# Patient Record
Sex: Male | Born: 1975 | Race: Black or African American | Hispanic: No | Marital: Married | Smoking: Current every day smoker
Health system: Southern US, Community
[De-identification: ages and names within clinical notes are randomized; demographics above are authoritative.]

## PROBLEM LIST (undated history)

## (undated) DIAGNOSIS — I10 Essential (primary) hypertension: Secondary | ICD-10-CM

---

## 2013-07-09 NOTE — ED Provider Notes (Signed)
 History   Chief Complaint  Patient presents with  . Hematuria  . Back Pain   Patient is a 38 y.o. male presenting with abdominal pain. The history is provided by the patient.  Abdominal Pain Pain location:  R flank Pain quality: aching   Pain radiates to:  Does not radiate Pain severity:  Moderate Onset quality:  Gradual Duration:  1 day Timing:  Constant Progression:  Worsening Chronicity:  New Relieved by:  Nothing Worsened by:  Nothing tried Ineffective treatments:  None tried Associated symptoms: chest pain, dysuria and hematuria   Associated symptoms: no anorexia, no belching, no chills, no constipation, no cough, no diarrhea, no fatigue, no fever, no flatus, no hematemesis, no hematochezia, no melena, no nausea, no shortness of breath and no vomiting     Past Medical History  Diagnosis Date  . Tobacco abuse   . Seasonal allergies     No past surgical history on file.  Family History  Problem Relation Age of Onset  . Stroke Paternal Grandfather 37  . Thyroid disease Neg Hx   . Skin cancer Neg Hx   . Prostate cancer Neg Hx   . Osteoporosis Neg Hx   . Osteoarthritis Neg Hx   . Hypertension Neg Hx   . Glaucoma Neg Hx   . Diabetes type II Neg Hx   . Depression Neg Hx   . Coronary artery disease Neg Hx   . Colon cancer Neg Hx   . Asthma Neg Hx   . Bipolar disorder Neg Hx   . Anxiety disorder Neg Hx   . Hyperlipidemia Neg Hx     History  Substance Use Topics  . Smoking status: Current Every Day Smoker -- 1.00 packs/day for 10 years  . Smokeless tobacco: Not on file  . Alcohol Use: 0.5 oz/week    1 drink(s) per week    Review of Systems  Constitutional: Negative.  Negative for fever, chills, diaphoresis, activity change, appetite change and fatigue.  HENT: Negative for congestion, drooling, ear discharge, ear pain, facial swelling, hearing loss, mouth sores and tinnitus.   Eyes: Negative.  Negative for pain.  Respiratory: Negative for cough, choking,  shortness of breath and stridor.   Cardiovascular: Positive for chest pain.  Gastrointestinal: Positive for abdominal pain. Negative for nausea, vomiting, diarrhea, constipation, melena, hematochezia, anorexia, flatus and hematemesis.  Endocrine: Negative.   Genitourinary: Positive for dysuria and hematuria.  Musculoskeletal: Negative.  Negative for neck pain.  Skin: Negative.   Allergic/Immunologic: Negative for environmental allergies.  Neurological: Negative for seizures, syncope, speech difficulty, light-headedness, numbness and headaches.  Hematological: Negative for adenopathy.  Psychiatric/Behavioral: Negative for hallucinations, behavioral problems, confusion and decreased concentration.  All other systems reviewed and are negative.   Physical Exam  BP 176/93   Pulse 81   Temp(Src) 36.9 C (98.5 F) (Oral)   Resp 16   Ht 185.4 cm (6' 1)   Wt 86.183 kg (190 lb)   BMI 25.07 kg/m2     SpO2 99%   Physical Exam  Constitutional: He is oriented to person, place, and time. He appears well-developed and well-nourished. No distress.  HENT:  Head: Normocephalic.  Eyes: Conjunctivae and EOM are normal. Pupils are equal, round, and reactive to light. Right eye exhibits no discharge. Left eye exhibits no discharge. No scleral icterus.  Neck: Normal range of motion.  Cardiovascular: Normal rate, regular rhythm, normal heart sounds and intact distal pulses.  Exam reveals no gallop.   No murmur heard.  Pulmonary/Chest: Effort normal and breath sounds normal. No respiratory distress. He has no wheezes. He has no rales.  Abdominal: Soft. Bowel sounds are normal. He exhibits no distension. There is no tenderness. There is no rebound.  Musculoskeletal: Normal range of motion.  Neurological: He is alert and oriented to person, place, and time. No cranial nerve deficit. He exhibits normal muscle tone. Coordination normal.  Skin: Skin is warm and dry. He is not diaphoretic. No pallor.   Psychiatric: He has a normal mood and affect. His behavior is normal.  Nursing note and vitals reviewed.   ED Course  MDM Number of Diagnoses or Management Options Urinary tract infection, site not specified: new and requires workup   Amount and/or Complexity of Data Reviewed Clinical lab tests: reviewed and ordered Tests in the radiology section of CPT: reviewed and ordered Review and summarize past medical records: yes Independent visualization of images, tracings, or specimens: yes  Risk of Complications, Morbidity, and/or Mortality Presenting problems: high Diagnostic procedures: high Management options: high  Patient Progress Patient progress: stable    ED COURSE 19:00 -- Doing well.  No new issues or complaints right now.   NEW CT A/P and LAB RESULTS Results for orders placed or performed during the hospital encounter of 07/09/13  CT renal stone protocol inc CT abd and pelvis wo contrast   Narrative   ABDOMINAL AND PELVIC CT WITHOUT CONTRAST (RENAL STONE PROTOCOL).  HISTORY: 789.09 Abdominal pain, other specified site, HEMATURIA, BACK PAIN.   COMPARISON: None.  TECHNIQUE: Above the diaphragms to the pubic symphysis without oral or IV contrast.  URINARY TRACT STONES: None.  HYDRONEPHROSIS: None.  ASCITES: None.  ADDITIONAL FINDINGS:  Bilateral low pelvic calcifications. In the absence of clear ureteral obstruction these more likely represent phleboliths rather than distal ureteral stones..  IMPRESSION:   1.No definite evidence of urinary tract stones or obstruction.  2. No evidence of appendicitis.      PRELIMINARY REPORT.  PLEASE LOOK FOR FULL FINAL REPORT TO FOLLOW.   DALE LOADER MD   A low dose protocol was utilized, optimized to detect urinary tract stones and obstruction. Due to artifact related to low-dose scanning, other structures and other potential etiologies for the patient's symptoms are not optimally evaluated. Recommend  followup as clinically indicated.   Urinalysis w/reflex to Microscopic  Result Value Ref Range   Color Red (!) Colorless, Straw, Light Yellow, Yellow, Dark Yellow   Clarity Cloudy (!) Clear   Specific Gravity 1.025 1.005-1.030   pH, Urine 6.5 5.0-8.0   Protein, Urinalysis 2+ (!) Negative   Glucose, Urinalysis Negative Negative   Ketones, Urinalysis Negative Negative   Blood, Urinalysis 3+ (!) Negative   Nitrite, Urinalysis Positive (!) Negative   Leukocytes, Urinalysis 1+ (!) Negative   Bilirubin, Urinalysis Negative Negative   Urobilinogen, Urinalysis 2.0 (H) 0.2-1.0 mg/dL  Complete Blood Count (CBC) with Differential  Result Value Ref Range   WBC (White Blood Cell Count) 11.7 (H) 4.8-10.8 x10^9/L   RBC (Red Blood Cell Count) 4.72 4.37-5.74 x10^12/L   Hemoglobin 13.7 13.7-17.3 g/dL   Hematocrit 59.3 (L) 57.9-47.9 %   MCV (Mean Corpuscular Volume) 86 80-98 fL   MCH (Mean Corpuscular Hemoglobin) 29.0 26.5-34.0 pg   MCHC (Mean Corpuscular Hemoglobin Concentration) 33.7 31.5-36.3 %   RDW-CV (Red Cell Distribution Width) 12.4 11.5-14.5 %   Plt (platelets) 185 150-450 x10^9/L   Neutrophils 9.5 (H) 2.0-8.6 x10^9/L   Neutrophil % 81.2 (H) 37.0-80.0 %   Lymphocyte Count 1.5 0.6-4.2  x10^9/L   Lymphocyte % 12.6 10.0-50.0 %   Monocyte Count 0.5 0.0-0.9 x10^9/L   Monocyte % 4.4 0.0-12.0 %   Eosinophils 0.10 0.00-0.70 x10^9/L   Eosinophil % 0.9 0.0-7.0 %   Basophils 0.07 0.00-0.20 x10^9/L   Basophil% 0.6 0.0-2.0 %   Immature Granulocyte Count 0.04 <1.00 10^3/L   Immature Granulocyte % 0.3 <1.0 %   NRBC (Nucleated Red Blood Cell Count) 0.00 0.00-2.00 x10^9/L   NRBC % (Nucleated Red Blood Cell %) 0.0 %  Basic Metabolic Panel (BMP)  Result Value Ref Range   Sodium 135 135-145 mmol/L   Potassium 3.8 3.5-5.0 mmol/L   Chloride 103 98-108 mmol/L   Carbon Dioxide (CO2) 29 21-30 mmol/L   Urea Nitrogen (BUN) 16 7-20 mg/dL   Creatinine 1.1 9.3-8.6 mg/dL   Glucose 85 29-859 mg/dL   Calcium  9.7 1.2-89.7 mg/dL   Anion Gap 7 2-82 mmol/L   BUN/Crea Ratio 15 (L) 20-30   Glomerular Filtration Rate (eGFR), MDRD Estimate >60 mL/min/1.73sq m  Hepatic Function Panel (HFP)  Result Value Ref Range   Protein, Total 7.7 5.8-7.8 g/dL   Albumin 4.7 6.4-5.1 g/dL   Bilirubin, Total 0.6 0.4-1.5 mg/dL   Bilirubin, Conjugated 0.1 0.1-0.6 mg/dL   Alk Phos (alkaline Phosphatase) 73 24-110 U/L   AST (Aspartate Aminotransferase) 16 15-41 U/L   Alt (alanine Aminotransfrase) 15 (L) 17-63 U/L  Creatine Kinase (CK), Total and MB Fraction (CKMB)(DRH / DRAH)  Result Value Ref Range   Creatine Kinase (CK) 295 (H) 30-220 U/L   CK- MB (Creatine Kinase, MB Fraction) 1 0-8 ng/mL   MB RELATIVE PERCENT 0 0-4 %  Troponin I (DRH,DRAH,DPC)  Result Value Ref Range   Troponin I 0.01 <0.50 ng/mL   Narrative   =>0.5ng/mL:  Positive, consistent with Cardiac Ischemia 0.06-0.49ng/mL:  Indeterminate value of uncertain significance <0.06ng/mL:  Negative  UA Microscopic  Result Value Ref Range   Red Blood Cells, Urinalysis >50 (H) <=3 /hpf   WBC, UA 10 (H) <=5 /hpf   Squamous Epithelial Cells, Urinalysis 1 /hpf   Hyaline Casts  /lpf   Bacteria, Urinalysis >50 (!) 0-5 /hpf     MEDICATIONS GIVEN IN THE ED Medications  sodium chloride  0.9 % bolus 1,000 mL (1,000 mLs Intravenous New Bag 07/09/13 1857)  cefTRIAXone  (ROCEPHIN ) 1 g in sodium chloride  0.9 % 100 mL IVPB (1 g Intravenous New Bag 07/09/13 1857)    NEW PRESCRIPTIONS New Prescriptions   SULFAMETHOXAZOLE-TRIMETHOPRIM (BACTRIM DS) 800-160 MG TABLET    Take 1 tablet (160 mg of trimethoprim total) by mouth 2 (two) times daily.   TRAMADOL  (ULTRAM ) 50 MG TABLET    Take 1 tablet (50 mg total) by mouth every 6 (six) hours as needed.    PATIENT IS TO FOLLOW UP WITH Norleen Dalene Bertin, MD [address] [place] [address] [phone]  Schedule an appointment as soon as possible for a visit in 2 days     Lynwood Lonni Hoots, DO 07/09/13  [id number]

## 2013-07-17 NOTE — ED Provider Notes (Signed)
 History   Chief Complaint  Patient presents with  . Back Pain - Thoracic Area   Patient is a 38 y.o. male presenting with back pain. The history is provided by the patient.  Back Pain Location:  Thoracic spine Quality:  Stiffness and stabbing Radiates to:  Does not radiate Pain severity:  Severe Onset quality:  Sudden Duration: today. Timing:  Constant Progression:  Unchanged Chronicity:  New Context: recent illness   Context comment:  Was sweeping floor & suddenly felt pain behind R shoulder blade. Worse w/ movement of RUE & torso as well as deep breathing. Denies trauma/injury. No CP, f/c. Does have sinus congestion but did not take his zyrtec today.  Recovering from recent UTI.  Relieved by:  None tried Worsened by:  Movement, deep breathing, bending, twisting and touching Ineffective treatments:  None tried Associated symptoms: numbness and tingling   Associated symptoms: no abdominal swelling, no chest pain, no fever and no headaches     Past Medical History  Diagnosis Date  . Tobacco abuse   . Seasonal allergies     History reviewed. No pertinent past surgical history.  Family History  Problem Relation Age of Onset  . Stroke Paternal Grandfather 73  . Thyroid disease Neg Hx   . Skin cancer Neg Hx   . Prostate cancer Neg Hx   . Osteoporosis Neg Hx   . Osteoarthritis Neg Hx   . Hypertension Neg Hx   . Glaucoma Neg Hx   . Diabetes type II Neg Hx   . Depression Neg Hx   . Coronary artery disease Neg Hx   . Colon cancer Neg Hx   . Asthma Neg Hx   . Bipolar disorder Neg Hx   . Anxiety disorder Neg Hx   . Hyperlipidemia Neg Hx     History  Substance Use Topics  . Smoking status: Current Every Day Smoker -- 1.00 packs/day for 10 years  . Smokeless tobacco: Not on file  . Alcohol Use: 0.5 oz/week    1 drink(s) per week    Review of Systems  Constitutional: Negative.  Negative for fever.  HENT: Negative.   Eyes: Negative.   Respiratory: Negative.    Cardiovascular: Negative.  Negative for chest pain.  Gastrointestinal: Negative.   Endocrine: Negative.   Genitourinary: Negative.   Musculoskeletal: Positive for back pain.  Skin: Negative.   Allergic/Immunologic: Negative.   Neurological: Positive for tingling and numbness. Negative for headaches.  Hematological: Negative.   Psychiatric/Behavioral: Negative.   All other systems reviewed and are negative.   Physical Exam  BP 142/101 mmHg  Pulse 80  Temp(Src) 37.1 C (98.8 F) (Oral)  Resp 20  SpO2 98%  Physical Exam  Constitutional: He is oriented to person, place, and time. He appears well-developed and well-nourished. He appears distressed.  HENT:  Head: Normocephalic and atraumatic.  Mouth/Throat: Oropharynx is clear and moist. No oropharyngeal exudate.  Eyes: EOM are normal.  Neck: Normal range of motion. Neck supple.  Cardiovascular: Normal rate, regular rhythm and normal heart sounds.  Exam reveals no gallop and no friction rub.   No murmur heard. Pulmonary/Chest: Effort normal and breath sounds normal. No respiratory distress. He has no wheezes. He has no rales.  Abdominal: Soft. He exhibits no distension. There is no tenderness.  Musculoskeletal: Normal range of motion. He exhibits tenderness.       Arms: Neurological: He is alert and oriented to person, place, and time. He has normal strength.  Skin: Skin  is warm and dry.  Psychiatric: He has a normal mood and affect. His behavior is normal. Judgment and thought content normal.  Vitals reviewed.   ED Course  MDM Number of Diagnoses or Management Options Acute upper back pain: new and does not require workup Muscle spasm of back: new and does not require workup Diagnosis management comments: Toradol  60mg  IM given as well as valium 10mg  po in ED.  Encouraged hydration, moist heat, stretching and pressure point massage as tolerated.  Rx given for Valium and Naprosyn.  Instructed to avoid any strenuous activity  over next few days.  Risk of Complications, Morbidity, and/or Mortality Presenting problems: moderate Diagnostic procedures: low Management options: moderate  Patient Progress Patient progress: stable      Cindia Vallery Roads, MD 07/17/13 2306

## 2013-10-04 NOTE — ED Provider Notes (Signed)
 History   Chief Complaint  Patient presents with  . Insect Bite   HPI Comments: Pt bitten by unknown bug Friday.  He now has swelling to R lower leg Pt has not taken htn meds x months.  No cp, sob, ha, blurry vision  Patient is a 38 y.o. male presenting with rash. The history is provided by the patient.  Rash Location:  Leg Leg rash location:  R lower leg Quality: painful   Pain details:    Quality:  Aching   Severity:  Moderate   Onset quality:  Sudden   Duration:  2 days   Timing:  Constant   Progression:  Unchanged Severity:  Mild Onset quality:  Sudden Timing:  Constant Progression:  Worsening Chronicity:  New Context: insect bite/sting   Relieved by:  Nothing Worsened by:  Nothing tried Ineffective treatments:  None tried Associated symptoms: no fever, no headaches, no nausea, no shortness of breath, no sore throat and not vomiting     Past Medical History  Diagnosis Date  . Tobacco abuse   . Seasonal allergies     History reviewed. No pertinent past surgical history.  Family History  Problem Relation Age of Onset  . Stroke Paternal Grandfather 53  . Thyroid disease Neg Hx   . Skin cancer Neg Hx   . Prostate cancer Neg Hx   . Osteoporosis Neg Hx   . Osteoarthritis Neg Hx   . Hypertension Neg Hx   . Glaucoma Neg Hx   . Diabetes type II Neg Hx   . Depression Neg Hx   . Coronary artery disease Neg Hx   . Colon cancer Neg Hx   . Asthma Neg Hx   . Bipolar disorder Neg Hx   . Anxiety disorder Neg Hx   . Hyperlipidemia Neg Hx     History  Substance Use Topics  . Smoking status: Current Every Day Smoker -- 1.00 packs/day for 10 years  . Smokeless tobacco: Not on file  . Alcohol Use: 0.5 oz/week    1 drink(s) per week    Review of Systems  Constitutional: Negative for fever.  HENT: Negative for sore throat.   Respiratory: Negative for shortness of breath.   Gastrointestinal: Negative for nausea and vomiting.  Skin: Positive for rash.   Neurological: Negative for headaches.  All other systems reviewed and are negative.   Physical Exam  BP 156/97 mmHg  Pulse 65  Temp(Src) 36.7 C (98.1 F) (Oral)  Resp 18  Ht 182.9 cm (6' 0.01)  Wt 94.348 kg (208 lb)  BMI 28.20 kg/m2  SpO2 100%  Physical Exam  Constitutional: He is oriented to person, place, and time. He appears well-developed and well-nourished. No distress.  HENT:  Head: Normocephalic and atraumatic.  Right Ear: External ear normal.  Left Ear: External ear normal.  Nose: Nose normal.  Mouth/Throat: Oropharynx is clear and moist.  Eyes: Conjunctivae and EOM are normal. Pupils are equal, round, and reactive to light.  Neck: Normal range of motion. Neck supple.  Cardiovascular: Normal rate, regular rhythm, normal heart sounds and intact distal pulses.   Pulmonary/Chest: Effort normal and breath sounds normal. No respiratory distress. He has no wheezes. He has no rales. He exhibits no tenderness.  Abdominal: Soft. Bowel sounds are normal. He exhibits no distension. There is no tenderness.  Musculoskeletal: Normal range of motion. He exhibits no edema or tenderness.  Neurological: He is alert and oriented to person, place, and time.  Skin: Skin is  warm and dry. He is not diaphoretic. No erythema.  3 papules to right lower leg with surrounding erythemal.  No drainage or fluctuance  Psychiatric: He has a normal mood and affect. His behavior is normal. Judgment and thought content normal.  Nursing note and vitals reviewed.   ED Course  MDM Number of Diagnoses or Management Options Bug bite with infection: new and does not require workup HTN (hypertension): new and does not require workup Diagnosis management comments: Bug bite, infected- bactrim, keflex htn- lisinopril  Risk of Complications, Morbidity, and/or Mortality Presenting problems: moderate Diagnostic procedures: minimal Management options: moderate       Andrew Charlies Fluke, MD 10/04/13  [phone]

## 2013-12-09 NOTE — ED Provider Notes (Signed)
 History   Chief Complaint  Patient presents with  . Eye Pain   Patient is a 38 y.o. male presenting with eye problem. The history is provided by the patient and the spouse.  Eye Problem Location:  L eye Quality:  Aching Severity:  Severe Onset quality:  Sudden Duration:  1 day Timing:  Constant Progression:  Unchanged Chronicity:  New Relieved by:  Nothing Worsened by:  Nothing tried Ineffective treatments:  None tried Associated symptoms: redness and tearing   Associated symptoms: no blurred vision, no crusting, no decreased vision, no discharge, no foreign body sensation, no headaches, no inflammation, no itching, no nausea, no numbness, no photophobia, no swelling, no tingling, no vomiting and no weakness   Patient states he was working under a house a couple of days ago and may have gotten something in his left eye.  The pain started last night and just got worse since then.  He denies any other symptoms.   Past Medical History  Diagnosis Date  . Tobacco abuse   . Seasonal allergies   . Hypertension     History reviewed. No pertinent past surgical history.  Family History  Problem Relation Age of Onset  . Stroke Paternal Grandfather 47  . Thyroid disease Neg Hx   . Skin cancer Neg Hx   . Prostate cancer Neg Hx   . Osteoporosis Neg Hx   . Osteoarthritis Neg Hx   . Hypertension Neg Hx   . Glaucoma Neg Hx   . Diabetes type II Neg Hx   . Depression Neg Hx   . Coronary artery disease Neg Hx   . Colon cancer Neg Hx   . Asthma Neg Hx   . Bipolar disorder Neg Hx   . Anxiety disorder Neg Hx   . Hyperlipidemia Neg Hx     History  Substance Use Topics  . Smoking status: Current Every Day Smoker -- 1.00 packs/day for 10 years  . Smokeless tobacco: Not on file  . Alcohol Use: 0.5 oz/week    1 Not specified per week    Review of Systems  Constitutional: Negative.  Negative for chills, diaphoresis, activity change and appetite change.  HENT: Negative for  congestion, drooling, ear discharge, ear pain, facial swelling, hearing loss, mouth sores and tinnitus.   Eyes: Positive for redness. Negative for blurred vision, photophobia, pain, discharge and itching.  Respiratory: Negative for cough, choking, shortness of breath and stridor.   Cardiovascular: Negative.   Gastrointestinal: Negative.  Negative for nausea and vomiting.  Endocrine: Negative.   Genitourinary: Negative.   Musculoskeletal: Negative.  Negative for neck pain.  Skin: Negative.   Allergic/Immunologic: Negative for environmental allergies.  Neurological: Negative for tingling, seizures, syncope, speech difficulty, weakness, light-headedness, numbness and headaches.  Hematological: Negative for adenopathy.  Psychiatric/Behavioral: Negative for hallucinations, behavioral problems, confusion and decreased concentration.  All other systems reviewed and are negative.   Physical Exam  BP 169/101 mmHg  Pulse 70  Temp(Src) 36.9 C (98.4 F) (Oral)  Resp 18  Ht 185.4 cm (6' 0.99)  Wt 92.987 kg (205 lb)  BMI 27.05 kg/m2  SpO2 100%  Physical Exam  Constitutional: He is oriented to person, place, and time. He appears well-developed and well-nourished. No distress.  HENT:  Head: Normocephalic.  Right Ear: Tympanic membrane, external ear and ear canal normal.  Left Ear: Tympanic membrane, external ear and ear canal normal.  Nose: Nose normal. No mucosal edema or rhinorrhea.  Mouth/Throat: No oropharyngeal exudate, posterior oropharyngeal  edema, posterior oropharyngeal erythema or tonsillar abscesses.  Eyes: EOM and lids are normal. Pupils are equal, round, and reactive to light. Lids are everted and swept, no foreign bodies found. Right eye exhibits no chemosis, no discharge, no exudate and no hordeolum. No foreign body present in the right eye. Left eye exhibits no chemosis, no discharge, no exudate and no hordeolum. No foreign body present in the left eye. Right conjunctiva is not  injected. Right conjunctiva has no hemorrhage. Left conjunctiva is injected. Left conjunctiva has no hemorrhage. No scleral icterus. Right eye exhibits normal extraocular motion and no nystagmus. Left eye exhibits normal extraocular motion and no nystagmus.  Fundoscopic exam:      The right eye shows no exudate, no hemorrhage and no papilledema. The right eye shows no red reflex.       The left eye shows no exudate, no hemorrhage and no papilledema. The left eye shows no red reflex.  Slit lamp exam:      The right eye shows no corneal abrasion, no corneal flare, no corneal ulcer and no foreign body.       The left eye shows corneal abrasion and fluorescein uptake. The left eye shows no corneal flare, no corneal ulcer and no foreign body.    Neck: Normal range of motion.  Pulmonary/Chest: Effort normal. No respiratory distress.  Musculoskeletal: Normal range of motion.  Neurological: He is alert and oriented to person, place, and time. No cranial nerve deficit. He exhibits normal muscle tone. Coordination normal.  Skin: Skin is warm and dry. He is not diaphoretic. No pallor.  Psychiatric: He has a normal mood and affect. His behavior is normal.  Nursing note and vitals reviewed.   ED Course  MDM Number of Diagnoses or Management Options Left corneal abrasion, initial encounter: new and requires workup   Amount and/or Complexity of Data Reviewed Obtain history from someone other than the patient: yes Review and summarize past medical records: yes  Risk of Complications, Morbidity, and/or Mortality Presenting problems: low Diagnostic procedures: minimal Management options: low  Patient Progress Patient progress: stable    MEDICATIONS GIVEN IN THE ED Medications  proparacaine (ALCAINE) 0.5 % ophthalmic solution 2 drop (not administered)  fluorescein (FUL-GLO) ophthalmic strip 2 strip (not administered)  HYDROcodone -acetaminophen  (NORCO) 5-325 mg tablet 1 tablet (not administered)     NEW PRESCRIPTIONS New Prescriptions   HYDROCODONE -ACETAMINOPHEN  (NORCO) 5-325 MG TABLET    Take 1 tablet by mouth every 6 (six) hours as needed for Pain.   TOBRAMYCIN (TOBREX) 0.3 % OPHTHALMIC SOLUTION    Place 2 drops into the left eye every 4 (four) hours.    PATIENT IS TO FOLLOW UP WITH Norleen Dalene Bertin, MD  Schedule an appointment as soon as possible for a visit in 2 days   Dabiruddin M Humayun, MD [address] [place] [address] [phone]  Schedule an appointment as soon as possible for a visit in 2 days     Lynwood Lonni Hoots, DO 12/09/13 1119

## 2014-01-19 NOTE — ED Provider Notes (Signed)
 History   Chief Complaint  Patient presents with  . Testicle Pain  . Abdominal Pain  . Flank Pain   HPI Comments: Pt. Also with elevated blood pressure c/w previous according to significant other.  Pt. Non-compliant with his blood pressure medication.    No penile discharge.  No hematuria.  No scrotum or testicle or penile pain.    Patient is a 38 y.o. male presenting with abdominal pain. The history is provided by the patient.  Abdominal Pain Pain location:  R flank and RLQ Pain quality: aching and stabbing   Pain radiates to:  R flank Pain severity:  Severe Onset quality:  Sudden Duration:  2 days Timing:  Intermittent Progression:  Waxing and waning Chronicity:  New Context: awakening from sleep   Context: not alcohol use, not diet changes, not eating, not recent illness, not retching, not sick contacts and not suspicious food intake   Relieved by:  Nothing Worsened by:  Nothing tried Ineffective treatments:  None tried Associated symptoms: anorexia and nausea   Associated symptoms: no belching, no chest pain, no chills, no constipation, no cough, no diarrhea, no dysuria, no fatigue, no fever, no hematemesis, no hematochezia, no hematuria, no melena, no shortness of breath, no sore throat and no vomiting     Past Medical History  Diagnosis Date  . Tobacco abuse   . Seasonal allergies   . Hypertension     History reviewed. No pertinent past surgical history.  Family History  Problem Relation Age of Onset  . Stroke Paternal Grandfather 6  . Thyroid disease Neg Hx   . Skin cancer Neg Hx   . Prostate cancer Neg Hx   . Osteoporosis Neg Hx   . Osteoarthritis Neg Hx   . Hypertension Neg Hx   . Glaucoma Neg Hx   . Diabetes type II Neg Hx   . Depression Neg Hx   . Coronary artery disease Neg Hx   . Colon cancer Neg Hx   . Asthma Neg Hx   . Bipolar disorder Neg Hx   . Anxiety disorder Neg Hx   . Hyperlipidemia Neg Hx     History  Substance Use Topics  .  Smoking status: Current Every Day Smoker -- 1.00 packs/day for 10 years  . Smokeless tobacco: Not on file  . Alcohol Use: 0.5 oz/week    1 Not specified per week    Review of Systems  Constitutional: Negative for fever, chills and fatigue.  HENT: Negative for congestion and sore throat.   Eyes: Negative for pain and visual disturbance.  Respiratory: Negative for cough, shortness of breath and wheezing.   Cardiovascular: Negative for chest pain.  Gastrointestinal: Positive for nausea, abdominal pain and anorexia. Negative for vomiting, diarrhea, constipation, melena, hematochezia and hematemesis.  Genitourinary: Negative for dysuria and hematuria.  Musculoskeletal: Negative for back pain and neck stiffness.  Skin: Negative for rash.  Neurological: Negative for headaches.  All other systems reviewed and are negative.   Physical Exam  BP 168/105 mmHg  Pulse 78  Temp(Src) 36.6 C (97.9 F) (Oral)  Resp 18  Wt 92.987 kg (205 lb)  SpO2 99%  Physical Exam  Constitutional: He is oriented to person, place, and time. He appears well-developed and well-nourished. He appears distressed.  HENT:  Head: Normocephalic and atraumatic.  Nose: Nose normal.  Mouth/Throat: Oropharynx is clear and moist.  Eyes: Conjunctivae and EOM are normal. Pupils are equal, round, and reactive to light.  Neck: Trachea normal, normal  range of motion and full passive range of motion without pain. Neck supple.  Cardiovascular: Normal rate, regular rhythm, normal heart sounds and intact distal pulses.   Pulmonary/Chest: Effort normal and breath sounds normal.  Abdominal: Soft. Normal appearance and bowel sounds are normal. There is tenderness in the right lower quadrant. There is guarding. There is no rigidity, no rebound and no CVA tenderness.    Musculoskeletal: Normal range of motion.       Lumbar back: He exhibits tenderness.       Back:  Neurological: He is alert and oriented to person, place, and time.  He has normal strength. No cranial nerve deficit or sensory deficit. Coordination normal.  Skin: Skin is warm and dry.  Psychiatric: He has a normal mood and affect. His behavior is normal. Judgment and thought content normal.  Nursing note and vitals reviewed.   ED Course  MDM Number of Diagnoses or Management Options Essential hypertension: new and requires workup Flank pain: new and requires workup Pyelonephritis: new and requires workup Diagnosis management comments: Renal colic Pyelonephritis STD appendicitis    Amount and/or Complexity of Data Reviewed Clinical lab tests: reviewed and ordered Tests in the radiology section of CPT: reviewed Tests in the medicine section of CPT: ordered and reviewed Independent visualization of images, tracings, or specimens: yes  Risk of Complications, Morbidity, and/or Mortality Presenting problems: high Diagnostic procedures: high Management options: high General comments: Pt. With no acute process on CT.  Labs unremarkable except for UTI.  Pt. Without penile drainage.  H/o epididymitis, but no testicle pain now.  Treated with azithro and rocephin .  rx doxycycline.  F/u pmd.  Elevated bp chronic.  No signs of adverse effects yet.  Will f/u for this as well.    Patient Progress Patient progress: improved      Glendia Darryle Daring, MD 01/19/14 [phone]

## 2014-02-11 NOTE — ED Notes (Signed)
 Patient transported to [place]

## 2014-02-11 NOTE — ED Provider Notes (Signed)
 History   Chief Complaint  Patient presents with  . Testicle Pain    right testicle pain and swelling   Patient is a 38 y.o. male presenting with male genitourinary complaint. The history is provided by the patient and the spouse.  Male GU Problem Presenting symptoms: scrotal pain   Presenting symptoms: no dysuria   Scrotal pain:    Affected testicle:  Right   Severity:  Severe   Onset quality:  Gradual   Duration:  1 day   Timing:  Constant   Progression:  Worsening   Chronicity:  Recurrent Context: spontaneously   Ineffective treatments:  Prescription drugs (percocet PTA) Associated symptoms: no abdominal pain, no diarrhea, no fever, no hematuria, no nausea, no penile redness, no penile swelling, no priapism, no urinary frequency, no urinary hesitation and no vomiting     Past Medical History  Diagnosis Date  . Tobacco abuse   . Seasonal allergies   . Hypertension     History reviewed. No pertinent past surgical history.  Family History  Problem Relation Age of Onset  . Stroke Paternal Grandfather 45  . Thyroid disease Neg Hx   . Skin cancer Neg Hx   . Prostate cancer Neg Hx   . Osteoporosis Neg Hx   . Osteoarthritis Neg Hx   . Hypertension Neg Hx   . Glaucoma Neg Hx   . Diabetes type II Neg Hx   . Depression Neg Hx   . Coronary artery disease Neg Hx   . Colon cancer Neg Hx   . Asthma Neg Hx   . Bipolar disorder Neg Hx   . Anxiety disorder Neg Hx   . Hyperlipidemia Neg Hx     History  Substance Use Topics  . Smoking status: Current Every Day Smoker -- 1.00 packs/day for 10 years    Types: Cigarettes  . Smokeless tobacco: Not on file  . Alcohol Use: 0.5 oz/week    1 Not specified per week    Review of Systems  Constitutional: Negative for fever and chills.  HENT: Negative for ear pain and sore throat.   Eyes: Negative for pain and redness.  Respiratory: Negative for cough and shortness of breath.   Cardiovascular: Negative for chest pain and  palpitations.  Gastrointestinal: Negative for nausea, vomiting, abdominal pain and diarrhea.  Genitourinary: Positive for testicular pain. Negative for dysuria, hesitancy, frequency, hematuria and penile swelling.  Musculoskeletal: Negative for neck pain.  Neurological: Negative for seizures and headaches.  All other systems reviewed and are negative.   Physical Exam  BP 151/73 mmHg  Pulse 67  Temp(Src) 37 C (98.6 F) (Oral)  Resp 20  Ht 188 cm (6' 2)  Wt 92.987 kg (205 lb)  BMI 26.31 kg/m2  SpO2 96%  Physical Exam  Constitutional: He is oriented to person, place, and time. He appears well-developed and well-nourished.  HENT:  Head: Normocephalic and atraumatic.  Right Ear: External ear normal.  Left Ear: External ear normal.  Eyes: Conjunctivae are normal. Pupils are equal, round, and reactive to light.  Neck: Normal range of motion. Neck supple. No tracheal deviation present.  Cardiovascular: Normal rate, regular rhythm and normal heart sounds.   Pulmonary/Chest: Effort normal and breath sounds normal. No respiratory distress. He has no wheezes. He has no rhonchi. He has no rales.  Abdominal: Soft. Bowel sounds are normal. He exhibits no distension and no mass. There is no tenderness. There is no rebound and no guarding.  Genitourinary: Penis normal. Right  testis shows swelling and tenderness. Left testis shows no swelling and no tenderness. Uncircumcised. No discharge found.  Musculoskeletal: Normal range of motion. He exhibits no edema.  Neurological: He is alert and oriented to person, place, and time. No cranial nerve deficit. GCS eye subscore is 4. GCS verbal subscore is 5. GCS motor subscore is 6.  Skin: Skin is warm and dry. No rash noted. No cyanosis. Nails show no clubbing.  Psychiatric: He has a normal mood and affect. His speech is normal and behavior is normal.  Nursing note and vitals reviewed.   ED Course  MDM Number of Diagnoses or Management Options Right  epididymitis: new and requires workup Testicular pain, right: new and requires workup   Amount and/or Complexity of Data Reviewed Tests in the radiology section of CPT: ordered and reviewed Review and summarize past medical records: yes  Risk of Complications, Morbidity, and/or Mortality Presenting problems: moderate Diagnostic procedures: low Management options: low  Patient Progress Patient progress: stable      Paticia Tonye Molt, MD 02/11/14 [phone]

## 2014-08-13 NOTE — Progress Notes (Signed)
 Subjective:     Andrew Whitaker is a 39 y.o. male who presents for an established patient office visit and is here for a comprehensive physical exam. The patient reports problems  1. Corn between toes- athletes foot 2. Wants to quit smoking 3. Hypertension- went to hospital a few times with blood pressure a few times but never liked the way hctz and lisinopril made him feel 4. Having some discomfort in right testicle. Has had epididymytis in past . Feels the same. Present for 2 days. No swelling or mass felt. NO std exposures  Health Maintenance:  PSA: na Tetanus TD/TdaP 2013  Last lipid panel: ?  Health Habits: Smoking. positive for approximately 1 packs per day.  Patient advised to quit smoking Alcohol Use. occasional, social use Dental and eyes not up to date- just got insurance Exercise. rarely Are you taking calcium supplements? no Do you take any herbs or supplements that were not prescribed by a doctor? no  Social: Relationship status: married Lives with wife and 3 kids  Occupation: Software engineer   Past Medical History:  has a past medical history of Tobacco abuse; Seasonal allergies; and Hypertension. Problem List: has Tobacco abuse and Epididymitis, right on his problem list. Past Surgical History:  has no past surgical history on file. Family History: family history includes Asthma in his son; Hypertension in his father; Stroke (age of onset: 82) in his paternal grandfather. There is no history of Thyroid disease, Skin cancer, Prostate cancer, Osteoporosis, Osteoarthritis, Glaucoma, Diabetes type II, Depression, Coronary artery disease, Colon cancer, Bipolar disorder, Anxiety disorder, or Hyperlipidemia. Social History:  reports that he has been smoking Cigarettes.  He has a 10 pack-year smoking history. He does not have any smokeless tobacco history on file. He reports that he drinks about 0.5 oz of alcohol per week. He reports that he uses illicit drugs (Other-see  comments) about twice per week. Prior to encounter Medications:  Current Outpatient Prescriptions on File Prior to Visit  Medication Sig Dispense Refill  . loratadine (CLARITIN) 10 mg capsule Take 10 mg by mouth once daily.     No current facility-administered medications on file prior to visit.   Allergies: has No Known Allergies.  Review of Systems Review of Systems -  General ROS: negative for - chills, fatigue, fever, sleep disturbance, weight gain or weight loss Psychological ROS: negative for - anxiety, behavioral disorder or depression ENT ROS: negative for - hearing change or visual changes Hematological and Lymphatic ROS: negative for - bleeding problems, bruising or night sweats Endocrine ROS: negative for - hair pattern changes Respiratory ROS: negative for - cough, orthopnea, shortness of breath or wheezing Cardiovascular ROS: negative for - chest pain, dyspnea on exertion, edema or irregular heartbeat Gastrointestinal ROS: no abdominal pain, change in bowel habits, or black or bloody stools Genito-Urinary as aobe ROS: no dysuria, trouble voiding, or hematuria Musculoskeletal ROS: negative for - new joint pain, joint stiffness, joint swelling, muscle pain or muscular weakness Neurological ROS: negative for - dizziness, gait disturbance, headaches, numbness/tingling or speech problems Dermatological ROS: chronic athletes food  Objective:     Filed Vitals:   08/13/14 1523  BP: 136/86  Pulse: 88  Temp: 36.8 C (98.2 F)  Resp: 16    Objective:   Vital signs:    Filed Vitals:   08/13/14 1523  BP: 136/86  Pulse: 88  Temp: 36.8 C (98.2 F)  Resp: 16    General:  39 y.o.-year-old male who is alert and  cooperative.  No acute distress. Mental status normal. Skin : feet with scaly peeling between toes macerated.  HEENT:  Pupils equal round reactive to light and accommodation.  Tympanic membranes normal.  Nasal passageways normal.  Oral mucosa normal. Neck:  No  adenopathy or thyroid enlargement.  No jugular venous distention.  No masses. No carotid bruits. No axillary masses Lungs: No respiratory distress.  Clear to auscultation and percussion.  No wheezing, rhonchi, or rales. Heart:  Regular rate rhythm.  No murmurs, heaves or rubs.  No S3. Pulses 2 plus and equal. Abdomen:  Bowel sounds present.  No hepatosplenomegaly.  No masses or tenderness.  No tenderness No guarding or rebound. Musculoskeletal:  No muscle weakness or atrophy.  Gait is normal.   Extremities: No lower extremity  Edema. Distal pulses 2+.  Capillary refill is normal. Neurologic:  Cranial nerves 2-12 are intact.  No focal neurologic deficits.  Deep tendon reflexes are 2+.  No muscle weakness.  Normal sensation. Psychologic:  Normal mental status.  No depression. No anxiety Genitalia- normal male genitals. Mild tenderness of right cord E[place] )  Assessment:    Healthy maintenance exam.   Plan:  1. Corn of foot Treat athletes foot with tinactin. Keep feet dry. Treat shoes - Ambulatory Referral to Podiatry  2. Epididymitis, right  Cover with doxy. Send urine - Chlamydia Trachomatis and Neisseria Gonorrhoeae, DNA Amplification  3. Tobacco abuse. Discussed options for treatment would like to try chantix. 5 minutes counseling.  4. Routine medical exam  - Complete Blood Count (CBC) with Differential - Comprehensive Metabolic Panel (CMP) - Lipid Panel W/Calculated Low Density Lipoprotein (LDL) Cholesterol  Education reviewed: smoking cessation. Discussed the patient's BMI with him. The BMI is in the acceptable range

## 2014-08-24 NOTE — ED Provider Notes (Addendum)
 History   Chief Complaint  Patient presents with  . Chest Pain    started about 1 hr ago more on right side worse with deep breathing   HPI Comments: Pt with right sided chest pain for a couple hours.  Is sharp and gripping at times, better when he streches and move his arm around.  Pt has htn.  Denies sob, n/v or diaphoresis.  Non exertional.  Has had these episodes in the past but they usually go away.    The history is provided by the patient.    Past Medical History  Diagnosis Date  . Tobacco abuse   . Seasonal allergies   . Hypertension     No past surgical history on file.  Family History  Problem Relation Age of Onset  . Stroke Paternal Grandfather 37  . Thyroid disease Neg Hx   . Skin cancer Neg Hx   . Prostate cancer Neg Hx   . Osteoporosis Neg Hx   . Osteoarthritis Neg Hx   . Glaucoma Neg Hx   . Diabetes type II Neg Hx   . Depression Neg Hx   . Coronary artery disease Neg Hx   . Colon cancer Neg Hx   . Bipolar disorder Neg Hx   . Anxiety disorder Neg Hx   . Hyperlipidemia Neg Hx   . Hypertension Father   . Asthma Son     History  Substance Use Topics  . Smoking status: Current Every Day Smoker -- 1.00 packs/day for 10 years    Types: Cigarettes  . Smokeless tobacco: Not on file  . Alcohol Use: 0.5 oz/week    1 Standard drinks or equivalent per week      Review of Systems  All other systems reviewed and are negative.   Physical Exam  BP 146/91 mmHg  Pulse 76  Temp(Src) 36.4 C (97.5 F) (Oral)  Resp 17  Ht 185.4 cm (6' 1)  Wt 92.987 kg (205 lb)  BMI 27.05 kg/m2  SpO2 99%  Physical Exam  Constitutional: He is oriented to person, place, and time. He appears well-developed and well-nourished. No distress.  HENT:  Head: Normocephalic and atraumatic.  Right Ear: External ear normal.  Left Ear: External ear normal.  Nose: Nose normal.  Eyes: Conjunctivae are normal. No scleral icterus.  Neck: Normal range of motion. Neck supple.   Cardiovascular: Normal rate, regular rhythm, normal heart sounds and intact distal pulses.   Pulmonary/Chest: Effort normal and breath sounds normal. No respiratory distress. He exhibits tenderness (reproducible pain on right lateral pec area. ).  Abdominal: Soft. He exhibits no distension.  Musculoskeletal: Normal range of motion.  Neurological: He is alert and oriented to person, place, and time. He exhibits normal muscle tone.  Skin: Skin is warm and dry. He is not diaphoretic.  Nursing note and vitals reviewed.   ED Course  MDM Number of Diagnoses or Management Options Musculoskeletal pain:  Diagnosis management comments: ekg with lvh but otherwise non acute.  Checking labs but would consider this more c/w chest wall pain than acs or pe at this time.  1:51 AM Labwork is all reassuring.  Neg dimer and ce.  Improved with meds.       Amount and/or Complexity of Data Reviewed Clinical lab tests: ordered and reviewed Tests in the radiology section of CPT: ordered and reviewed Tests in the medicine section of CPT: reviewed and ordered Decide to obtain previous medical records or to obtain history from someone other  than the patient: yes Review and summarize past medical records: yes Independent visualization of images, tracings, or specimens: yes  Risk of Complications, Morbidity, and/or Mortality Presenting problems: high Diagnostic procedures: high Management options: high  Patient Progress Patient progress: improved      Evalene Donnice Ill, MD 08/24/14 0040  Evalene Donnice Ill, MD 08/24/14 [id number]

## 2015-04-20 ENCOUNTER — Emergency Department (HOSPITAL_COMMUNITY)
Admission: EM | Admit: 2015-04-20 | Discharge: 2015-04-20 | Disposition: A | Discharge status: Home / Self Care | Payer: Self-pay

## 2015-04-21 ENCOUNTER — Emergency Department (HOSPITAL_COMMUNITY)
Admission: EM | Admit: 2015-04-21 | Discharge: 2015-04-21 | Disposition: A | Discharge status: Home / Self Care | Payer: Self-pay | Attending: Emergency Medicine | Admitting: Emergency Medicine

## 2015-04-21 ENCOUNTER — Encounter (HOSPITAL_COMMUNITY): Payer: Self-pay | Admitting: *Deleted

## 2015-04-21 DIAGNOSIS — J0100 Acute maxillary sinusitis, unspecified: Secondary | ICD-10-CM | POA: Insufficient documentation

## 2015-04-21 DIAGNOSIS — M542 Cervicalgia: Secondary | ICD-10-CM | POA: Insufficient documentation

## 2015-04-21 DIAGNOSIS — F17210 Nicotine dependence, cigarettes, uncomplicated: Secondary | ICD-10-CM | POA: Insufficient documentation

## 2015-04-21 MED ORDER — BUTALBITAL-APAP-CAFFEINE 50-325-40 MG PO TABS: 1.0000 | ORAL_TABLET | Freq: Four times a day (QID) | ORAL | Status: AC | PRN

## 2015-04-21 MED ORDER — AMOXICILLIN-POT CLAVULANATE 875-125 MG PO TABS: 1.0000 | ORAL_TABLET | Freq: Two times a day (BID) | ORAL | Status: DC

## 2015-04-21 MED ORDER — BUTALBITAL-APAP-CAFFEINE 50-325-40 MG PO TABS
1.0000 | ORAL_TABLET | Freq: Once | ORAL | Status: AC
  Administered 2015-04-21: 1 via ORAL
  Filled 2015-04-21: qty 1

## 2015-04-21 NOTE — Discharge Instructions (Signed)
Return for any numbness or weakness of her arms and legs. Return for difficulty with speech. Follow with her family doctor. Sinusitis, Adult Sinusitis is redness, soreness, and inflammation of the paranasal sinuses. Paranasal sinuses are air pockets within the bones of your face. They are located beneath your eyes, in the middle of your forehead, and above your eyes. In healthy paranasal sinuses, mucus is able to drain out, and air is able to circulate through them by way of your nose. However, when your paranasal sinuses are inflamed, mucus and air can become trapped. This can allow bacteria and other germs to grow and cause infection. Sinusitis can develop quickly and last only a short time (acute) or continue over a long period (chronic). Sinusitis that lasts for more than 12 weeks is considered chronic. CAUSES Causes of sinusitis include:  Allergies.  Structural abnormalities, such as displacement of the cartilage that separates your nostrils (deviated septum), which can decrease the air flow through your nose and sinuses and affect sinus drainage.  Functional abnormalities, such as when the small hairs (cilia) that line your sinuses and help remove mucus do not work properly or are not present. SIGNS AND SYMPTOMS Symptoms of acute and chronic sinusitis are the same. The primary symptoms are pain and pressure around the affected sinuses. Other symptoms include:  Upper toothache.  Earache.  Headache.  Bad breath.  Decreased sense of smell and taste.  A cough, which worsens when you are lying flat.  Fatigue.  Fever.  Thick drainage from your nose, which often is green and may contain pus (purulent).  Swelling and warmth over the affected sinuses. DIAGNOSIS Your health care provider will perform a physical exam. During your exam, your health care provider may perform any of the following to help determine if you have acute sinusitis or chronic sinusitis:  Look in your nose for  signs of abnormal growths in your nostrils (nasal polyps).  Tap over the affected sinus to check for signs of infection.  View the inside of your sinuses using an imaging device that has a light attached (endoscope). If your health care provider suspects that you have chronic sinusitis, one or more of the following tests may be recommended:  Allergy tests.  Nasal culture. A sample of mucus is taken from your nose, sent to a lab, and screened for bacteria.  Nasal cytology. A sample of mucus is taken from your nose and examined by your health care provider to determine if your sinusitis is related to an allergy. TREATMENT Most cases of acute sinusitis are related to a viral infection and will resolve on their own within 10 days. Sometimes, medicines are prescribed to help relieve symptoms of both acute and chronic sinusitis. These may include pain medicines, decongestants, nasal steroid sprays, or saline sprays. However, for sinusitis related to a bacterial infection, your health care provider will prescribe antibiotic medicines. These are medicines that will help kill the bacteria causing the infection. Rarely, sinusitis is caused by a fungal infection. In these cases, your health care provider will prescribe antifungal medicine. For some cases of chronic sinusitis, surgery is needed. Generally, these are cases in which sinusitis recurs more than 3 times per year, despite other treatments. HOME CARE INSTRUCTIONS  Drink plenty of water. Water helps thin the mucus so your sinuses can drain more easily.  Use a humidifier.  Inhale steam 3-4 times a day (for example, sit in the bathroom with the shower running).  Apply a warm, moist washcloth to  your face 3-4 times a day, or as directed by your health care provider.  Use saline nasal sprays to help moisten and clean your sinuses.  Take medicines only as directed by your health care provider.  If you were prescribed either an antibiotic or  antifungal medicine, finish it all even if you start to feel better. SEEK IMMEDIATE MEDICAL CARE IF:  You have increasing pain or severe headaches.  You have nausea, vomiting, or drowsiness.  You have swelling around your face.  You have vision problems.  You have a stiff neck.  You have difficulty breathing.   This information is not intended to replace advice given to you by your health care provider. Make sure you discuss any questions you have with your health care provider.   Document Released: 02/12/2005 Document Revised: 03/05/2014 Document Reviewed: 02/27/2011 Elsevier Interactive Patient Education [employer]2016 [employer]

## 2015-04-21 NOTE — ED Notes (Signed)
Pt with frontal headache for several days.  Pt is photophobic, but denies nausea.  States he has had similar headaches in the past, but none that have lasted this long.

## 2015-04-21 NOTE — ED Provider Notes (Signed)
CSN: [id number]     Arrival date & time 04/21/15  7829 History   First MD Initiated Contact with Patient 04/21/15 [phone]     Chief Complaint  Patient presents with  . Migraine     (Consider location/radiation/quality/duration/timing/severity/associated sxs/prior Treatment) Patient is a 40 y.o. male presenting with migraines. The history is provided by the patient.  Migraine This is a new problem. The current episode started more than 2 days ago. The problem occurs constantly. The problem has not changed since onset.Associated symptoms include headaches. Pertinent negatives include no chest pain, no abdominal pain and no shortness of breath. Exacerbated by: bright lights. Nothing relieves the symptoms. He has tried nothing for the symptoms. The treatment provided no relief.    40 yo M with a chief complaints of frontal headache. This been going on for the past 3 days. Patient denies any fevers or chills. Has had some sneezing and sinus drainage. Denies history of prior headaches. Slow on onset. Denies any neurologic deficits. Denies fevers or chills. Headache is worse with bright lights. Unsure of anything else that makes it worse. Denies other sick contacts. Start initially with frontal headache now patient has some left-sided lateral neck pain. Is worse with movement and palpation.  History reviewed. No pertinent past medical history. History reviewed. No pertinent past surgical history. No family history on file. Social History  Substance Use Topics  . Smoking status: Current Every Day Smoker -- 1.00 packs/day    Types: Cigarettes  . Smokeless tobacco: None  . Alcohol Use: No    Review of Systems  Constitutional: Negative for fever and chills.  HENT: Positive for congestion. Negative for facial swelling.   Eyes: Negative for discharge and visual disturbance.  Respiratory: Positive for cough. Negative for shortness of breath.   Cardiovascular: Negative for chest pain and palpitations.   Gastrointestinal: Negative for vomiting, abdominal pain and diarrhea.  Musculoskeletal: Positive for neck pain. Negative for myalgias and arthralgias.  Skin: Negative for color change and rash.  Neurological: Positive for headaches. Negative for tremors and syncope.  Psychiatric/Behavioral: Negative for confusion and dysphoric mood.      Allergies  Review of patient's allergies indicates no known allergies.  Home Medications   Prior to Admission medications   Medication Sig Start Date End Date Taking? Authorizing Provider  amoxicillin-clavulanate (AUGMENTIN) 875-125 MG tablet Take 1 tablet by mouth 2 (two) times daily. 04/21/15   Melene Plan, DO  butalbital-acetaminophen-caffeine (FIORICET) [phone] MG tablet Take 1-2 tablets by mouth every 6 (six) hours as needed for headache. 04/21/15 04/20/16  Melene Plan, DO   BP 161/110 mmHg  Pulse 69  Temp(Src) 98.4 F (36.9 C) (Oral)  Resp 20  Ht  (1.854 m)  Wt 210 lb (95.255 kg)  BMI 27.71 kg/m2  SpO2 99% Physical Exam  Constitutional: He is oriented to person, place, and time. He appears well-developed and well-nourished.  HENT:  Head: Normocephalic and atraumatic.  Swollen turbinates posterior nasal drip significant sinus tenderness to palpation of the left maxillary sinus.  Eyes: EOM are normal. Pupils are equal, round, and reactive to light.  Neck: Normal range of motion. Neck supple. No JVD present.  Cardiovascular: Normal rate and regular rhythm.  Exam reveals no gallop and no friction rub.   No murmur heard. Pulmonary/Chest: No respiratory distress. He has no wheezes.  Abdominal: He exhibits no distension. There is no tenderness. There is no rebound and no guarding.  Musculoskeletal: Normal range of motion.  Neurological: He is  alert and oriented to person, place, and time. He has normal strength. No cranial nerve deficit or sensory deficit. He displays a negative Romberg sign. Coordination and gait normal. GCS eye subscore is  4. GCS verbal subscore is 5. GCS motor subscore is 6. He displays no Babinski's sign on the right side. He displays no Babinski's sign on the left side.  Reflex Scores:      Tricep reflexes are 2+ on the right side and 2+ on the left side.      Bicep reflexes are 2+ on the right side and 2+ on the left side.      Brachioradialis reflexes are 2+ on the right side and 2+ on the left side.      Patellar reflexes are 2+ on the right side and 2+ on the left side.      Achilles reflexes are 2+ on the right side and 2+ on the left side. Skin: No rash noted. No pallor.  Psychiatric: He has a normal mood and affect. His behavior is normal.  Nursing note and vitals reviewed.   ED Course  Procedures (including critical care time) Labs Review Labs Reviewed - No data to display  Imaging Review No results found. I have personally reviewed and evaluated these images and lab results as part of my medical decision-making.   EKG Interpretation None      MDM   Final diagnoses:  Acute maxillary sinusitis, recurrence not specified    39  Yo M with a chief complaint of headache. Patient has tenderness to his maxillary sinus as well as significant posterior nasal drip feel that this is likely to be a sinusitis. Patient also having neck pain that started after the headache is no history of neck trauma. Feel this is unlikely to be vertebral or carotid artery dissection. The patient follow-up with family doctor.   3:39 PM:  I have discussed the diagnosis/risks/treatment options with the patient and believe the pt to be eligible for discharge home to follow-up with PCP. We also discussed returning to the ED immediately if new or worsening sx occur. We discussed the sx which are most concerning (e.g., sudden worsening pain, fever, inability to tolerate by mouth) that necessitate immediate return. Medications administered to the patient during their visit and any new prescriptions provided to the patient are  listed below.  Medications given during this visit Medications  butalbital-acetaminophen-caffeine (FIORICET, ESGIC) 50-325-40 MG per tablet 1 tablet (1 tablet Oral Given 04/21/15 1037)    Discharge Medication List as of 04/21/2015 10:12 AM    START taking these medications   Details  amoxicillin-clavulanate (AUGMENTIN) 875-125 MG tablet Take 1 tablet by mouth 2 (two) times daily., Starting 04/21/2015, Until Discontinued, Print    butalbital-acetaminophen-caffeine (FIORICET) 50-325-40 MG tablet Take 1-2 tablets by mouth every 6 (six) hours as needed for headache., Starting 04/21/2015, Until Fri 04/20/16, Print        The patient appears reasonably screen and/or stabilized for discharge and I doubt any other medical condition or other [facility] requiring further screening, evaluation, or treatment in the ED at this time prior to discharge.    Melene Plan, DO 04/21/15 1540

## 2015-04-21 NOTE — ED Notes (Signed)
Pt here with complaints of sinus drainage, sneezing, and headache x 4 days.

## 2015-06-23 ENCOUNTER — Encounter (HOSPITAL_COMMUNITY): Payer: Self-pay | Admitting: Oncology

## 2015-06-23 ENCOUNTER — Emergency Department (HOSPITAL_COMMUNITY)
Admission: EM | Admit: 2015-06-23 | Discharge: 2015-06-24 | Disposition: A | Discharge status: Home / Self Care | Payer: Self-pay | Attending: Emergency Medicine | Admitting: Emergency Medicine

## 2015-06-23 ENCOUNTER — Emergency Department (HOSPITAL_COMMUNITY): Payer: Self-pay

## 2015-06-23 DIAGNOSIS — Z79899 Other long term (current) drug therapy: Secondary | ICD-10-CM | POA: Insufficient documentation

## 2015-06-23 DIAGNOSIS — M779 Enthesopathy, unspecified: Secondary | ICD-10-CM | POA: Insufficient documentation

## 2015-06-23 DIAGNOSIS — F17210 Nicotine dependence, cigarettes, uncomplicated: Secondary | ICD-10-CM | POA: Insufficient documentation

## 2015-06-23 NOTE — ED Notes (Signed)
Pt c/o right hand pain.  States he injured in the past however never it had it checked out.  States it hurts to open or close right hand.  Pt described pain as sharp and throbbing in nature, rates 9/10.

## 2015-06-24 MED ORDER — TRAMADOL HCL 50 MG PO TABS
50.0000 mg | ORAL_TABLET | Freq: Once | ORAL | Status: AC
  Administered 2015-06-24: 50 mg via ORAL
  Filled 2015-06-24: qty 1

## 2015-06-24 MED ORDER — PREDNISONE 10 MG PO TABS: 20.0000 mg | ORAL_TABLET | Freq: Every day | ORAL | Status: DC

## 2015-06-24 MED ORDER — TRAMADOL HCL 50 MG PO TABS: 50.0000 mg | ORAL_TABLET | Freq: Four times a day (QID) | ORAL | Status: DC | PRN

## 2015-06-24 NOTE — Discharge Instructions (Signed)
Tendinitis and Tenosynovitis  °Tendinitis is inflammation of the tendon. Tenosynovitis is inflammation of the lining around the tendon (tendon sheath). These painful conditions often occur at once. Tendons attach muscle to bone. To move a limb, force from the muscle moves through the tendon, to the bone. These conditions often cause increased pain when moving. Tendinitis may be caused by a small or partial tear in the tendon.  °SYMPTOMS  °· Pain, tenderness, redness, bruising, or swelling at the injury. °· Loss of normal joint movement. °· Pain that gets worse with use of the muscle and joint attached to the tendon. °· Weakness in the tendon, caused by calcium build up that may occur with tendinitis. °· Commonly affected tendons: °¨ Achilles tendon (calf of leg). °¨ Rotator cuff (shoulder joint). °¨ Patellar tendon (kneecap to shin). °¨ Peroneal tendon (ankle). °¨ Posterior tibial tendon (inner ankle). °¨ Biceps tendon (in front of shoulder). °CAUSES  °· Sudden strain on a flexed muscle, muscle overuse, sudden increase or change in activity, vigorous activity. °· Result of a direct hit (less common). °· Poor muscle action (biomechanics). °RISK INCREASES WITH: °· Injury (trauma). °· Too much exercise. °· Sudden change in athletic activity. °· Incorrect exercise form or technique. °· Poor strength and flexibility. °· Not warming-up properly before activity. °· Returning to activity before healing is complete. °PREVENTION  °· Warm-up and stretch properly before activity. °· Maintain physical fitness: °¨ Joint flexibility. °¨ Muscle strength and endurance. °¨ Fitness that increases heart rate. °· Learn and use proper exercise techniques. °· Use rehabilitation exercises to strengthen weak muscles and tendons. °· Ice the tendon after activity, to reduce recurring inflammation. °· Wear proper fitting protective equipment for specific tendons, when indicated. °PROGNOSIS  °When treated properly, can be cured in 6 to 8 weeks.  Recovery may take longer, depending on degree of injury.  °RELATED COMPLICATIONS  °· Re-injury or recurring symptoms. °· Permanent weakness or joint stiffness, if injury is severe and recovery is not completed. °· Delayed healing, if sports are started before healing is complete. °· Tearing apart (rupture) of the inflamed tendon. Tendinitis means the tendon is injured and must recover. °TREATMENT  °Treatment first involves ice, medicine, and rest from aggravating activities. This reduces pain and inflammation. Modifying your activity may be considered to prevent recurring injury. A brace, elastic bandage wrap, splint, cast, or sling may be prescribed to protect the joint for a short period. After that period, strengthening and stretching exercise may help to regain strength and full range of motion. If the condition persists, despite non-surgical treatment, surgery may be recommended to remove the inflamed tendon lining. Corticosteroid injections may be given to reduce inflammation. However, these injections may weaken the tendon and increase your risk for tendon rupture. °MEDICATION  °· If pain medicine is needed, nonsteroidal anti-inflammatory medicines (aspirin and ibuprofen), or other minor pain relievers (acetaminophen), are often recommended. °· Do not take pain medicine for 7 days before surgery. °· Prescription pain relievers are usually prescribed only after surgery. Use only as directed and only as much as you need. °· Ointments applied to the skin may be helpful. °· Corticosteroid injections may be given to reduce inflammation. However, this may increase your risk of a tendon rupture. °HEAT AND COLD °· Cold treatment (icing) relieves pain and reduces inflammation. Cold treatment should be applied for 10 to 15 minutes every 2 to 3 hours, and immediately after activity that aggravates your symptoms. Use ice packs or an ice massage. °· Heat   treatment may be used before performing stretching and strengthening  activities prescribed by your caregiver, physical therapist, or athletic trainer. Use a heat pack or a warm water soak. °SEEK MEDICAL CARE IF:  °· Symptoms get worse or do not improve, despite treatment. °· Pain becomes too much to tolerate. °· You develop numbness or tingling. °· Toes become cold, or toenails become blue, gray, or dark colored. °· New, unexplained symptoms develop. (Drugs used in treatment may produce side effects.) °  °This information is not intended to replace advice given to you by your health care provider. Make sure you discuss any questions you have with your health care provider. °  °Document Released: 02/12/2005 Document Revised: 05/07/2011 Document Reviewed: 05/27/2008 °Elsevier Interactive Patient Education ©2016 [employer] ° °

## 2015-06-24 NOTE — ED Provider Notes (Signed)
CSN: [id number]     Arrival date & time 06/23/15  2258 History   First MD Initiated Contact with Patient 06/23/15 2332     Chief Complaint  Patient presents with  . Hand Pain     (Consider location/radiation/quality/duration/timing/severity/associated sxs/prior Treatment) HPI   Patient to the ER for evaluation of right hand pain. He reports injuring it a couple of months ago while helping build a house but did not have it evaluated at that time or since then. 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 He is having pain opening and closing it. The pain today was exacerbated after he tried to catch something that was thrown to him. The pain is worse right ring finger. He describes it as sharp and throbbing. He denies numbness, tingling, weakness, redness, swelling, rash or drainage. Denies other joints having similar symptoms in the past.  History reviewed. No pertinent past medical history. History reviewed. No pertinent past surgical history. No family history on file. Social History  Substance Use Topics  . Smoking status: Current Every Day Smoker -- 1.00 packs/day    Types: Cigarettes  . Smokeless tobacco: Never Used  . Alcohol Use: No    Review of Systems  Review of Systems All other systems negative except as documented in the HPI. All pertinent positives and negatives as reviewed in the HPI.   Allergies  Review of patient's allergies indicates no known allergies.  Home Medications   Prior to Admission medications   Medication Sig Start Date End Date Taking? Authorizing Provider  amoxicillin-clavulanate (AUGMENTIN) 875-125 MG tablet Take 1 tablet by mouth 2 (two) times daily. 04/21/15   Melene Planan Floyd, DO  butalbital-acetaminophen-caffeine (FIORICET) 854653268550-325-40 MG tablet Take 1-2 tablets by mouth every 6 (six) hours as needed for headache. 04/21/15 04/20/16  Melene Planan Floyd, DO  predniSONE (DELTASONE) 10 MG tablet Take 2 tablets (20 mg total) by mouth daily.  06/24/15   Anylah Scheib Neva SeatGreene, PA-C  traMADol (ULTRAM) 50 MG tablet Take 1 tablet (50 mg total) by mouth every 6 (six) hours as needed. 06/24/15   Morse Brueggemann Neva SeatGreene, PA-C   BP 140/92 mmHg  Pulse 71  Temp(Src) 98.2 F (36.8 C) (Oral)  Resp 20  Ht 6\' 1"  (1.854 m)  Wt 95.255 kg  BMI 27.71 kg/m2  SpO2 98% Physical Exam  Constitutional: He appears well-developed and well-nourished. No distress.  HENT:  Head: Normocephalic and atraumatic.  Eyes: Pupils are equal, round, and reactive to light.  Neck: Normal range of motion. Neck supple.  Cardiovascular: Normal rate and regular rhythm.   Pulmonary/Chest: Effort normal.  Abdominal: Soft.  Musculoskeletal:  Tenderness with ROM of 4th and 5th digit ROM, all the way down to the wrist. He has tenderness to palpation along the ulnar aspect of his forearm. No swelling, erythema, induration. CR < 2 seconds. sensations intact.  Neurological: He is alert.  Skin: Skin is warm and dry.  Nursing note and vitals reviewed.   ED Course  Procedures (including critical care time) Labs Review Labs Reviewed - No data to display  Imaging Review Dg Hand Complete Right  06/24/2015  CLINICAL DATA:  Injury to the right hand while lifting objects. Pain at the fourth and fifth metacarpals. Initial encounter. EXAM: RIGHT HAND - COMPLETE 3+ VIEW COMPARISON:  None. FINDINGS: There is no evidence of fracture or dislocation. The joint spaces are preserved. The carpal rows are intact, and demonstrate normal alignment. The soft tissues are unremarkable in appearance. IMPRESSION: No  evidence of fracture or dislocation. Electronically Signed   By: Roanna Raider M.D.   On: 06/24/2015 00:04   I have personally reviewed and evaluated these images and lab results as part of my medical decision-making.   EKG Interpretation None      MDM   Final diagnoses:  Tendonitis   Patient X-Ray negative for obvious fracture or dislocation.  Pt advised to follow up with dr. Merlyn Lot the  hand specialist. Patient given ACE wrap for ulnar immobilization of 4th and 5th digit and wrist while in ED, conservative therapy recommended and discussed. Patient will be discharged home & is agreeable with above plan. Returns precautions discussed. Pt appears safe for discharge.  Given prednisone dose pack and Ultram, recommend immobilization for 1-2 weeks. If he does improve or pain reoccurs will need to see Dr. Merlyn Lot the hand specialist.     Marlon Pel, PA-C 06/24/15 1610  Derwood Kaplan, MD 06/24/15 1840

## 2015-09-28 ENCOUNTER — Encounter (HOSPITAL_COMMUNITY): Payer: Self-pay | Admitting: Emergency Medicine

## 2015-09-28 ENCOUNTER — Emergency Department (HOSPITAL_COMMUNITY)
Admission: EM | Admit: 2015-09-28 | Discharge: 2015-09-28 | Disposition: A | Discharge status: Home / Self Care | Payer: Self-pay | Attending: Emergency Medicine | Admitting: Emergency Medicine

## 2015-09-28 ENCOUNTER — Emergency Department (HOSPITAL_COMMUNITY): Payer: Self-pay

## 2015-09-28 DIAGNOSIS — Z7952 Long term (current) use of systemic steroids: Secondary | ICD-10-CM | POA: Insufficient documentation

## 2015-09-28 DIAGNOSIS — R0789 Other chest pain: Secondary | ICD-10-CM | POA: Insufficient documentation

## 2015-09-28 DIAGNOSIS — R079 Chest pain, unspecified: Secondary | ICD-10-CM

## 2015-09-28 DIAGNOSIS — F17210 Nicotine dependence, cigarettes, uncomplicated: Secondary | ICD-10-CM | POA: Insufficient documentation

## 2015-09-28 DIAGNOSIS — Z792 Long term (current) use of antibiotics: Secondary | ICD-10-CM | POA: Insufficient documentation

## 2015-09-28 LAB — BASIC METABOLIC PANEL
ANION GAP: 4 — AB (ref 5–15)
BUN: 18 mg/dL (ref 6–20)
CO2: 24 mmol/L (ref 22–32)
Calcium: 9.2 mg/dL (ref 8.9–10.3)
Chloride: 113 mmol/L — ABNORMAL HIGH (ref 101–111)
Creatinine, Ser: 1.22 mg/dL (ref 0.61–1.24)
GFR calc Af Amer: >60 mL/min (ref >60)
GLUCOSE: 95 mg/dL (ref 65–99)
POTASSIUM: 4.2 mmol/L (ref 3.5–5.1)
Sodium: 141 mmol/L (ref 135–145)

## 2015-09-28 LAB — CBC
HEMATOCRIT: 37.7 % — AB (ref 39.0–52.0)
HEMOGLOBIN: 12.9 g/dL — AB (ref 13.0–17.0)
MCH: 29.7 pg (ref 26.0–34.0)
MCHC: 34.2 g/dL (ref 30.0–36.0)
MCV: 86.9 fL (ref 78.0–100.0)
Platelets: 224 10*3/uL (ref 150–400)
RBC: 4.34 MIL/uL (ref 4.22–5.81)
RDW: 13 % (ref 11.5–15.5)
WBC: 7.6 10*3/uL (ref 4.0–10.5)

## 2015-09-28 LAB — I-STAT TROPONIN, ED
TROPONIN I, POC: 0.01 ng/mL (ref 0.00–0.08)
Troponin i, poc: 0.01 ng/mL (ref 0.00–0.08)

## 2015-09-28 NOTE — ED Provider Notes (Signed)
[facility]-EMERGENCY DEPT Provider Note   CSN: [id number] Arrival date & time: 09/28/15  1702  First Provider Contact:  None       History   Chief Complaint Chief Complaint  Patient presents with  . Chest Pain    HPI Andrew Whitaker is a 40 y.o. male.  Patient presents with chest pain. He states he's had a two-week history of intermittent pains in his mid chest. It's nonradiating. It's a sharp pain that only last a couple seconds and then goes away. It's not associated with shortness of breath. It's not worse with movement. Not pleuritic. It's not worse on exertion. Not related to eating. He denies any leg pain or swelling. No cough or chest congestion. No nausea vomiting or diarrhea. He has a bump in his left upper arm that he's concerned about. He denies any other current complaints to me. He is a smoker. He does not have a known history of hypertension or hyperlipidemia. No diabetes. No family history of early coronary artery disease.    Chest Pain  Associated symptoms: chest pain   Associated symptoms: no blood in stool, no diarrhea, no headaches, no nausea, no shortness of breath, no vomiting and no weakness     History reviewed. No pertinent past medical history.  There are no active problems to display for this patient.   History reviewed. No pertinent surgical history.     Home Medications    Prior to Admission medications   Medication Sig Start Date End Date Taking? Authorizing Provider  amoxicillin-clavulanate (AUGMENTIN) 875-125 MG tablet Take 1 tablet by mouth 2 (two) times daily. 04/21/15   Melene Plan, DO  butalbital-acetaminophen-caffeine (FIORICET) [phone] MG tablet Take 1-2 tablets by mouth every 6 (six) hours as needed for headache. 04/21/15 04/20/16  Melene Plan, DO  predniSONE (DELTASONE) 10 MG tablet Take 2 tablets (20 mg total) by mouth daily. 06/24/15   Tiffany Neva Seat, PA-C  traMADol (ULTRAM) 50 MG tablet Take 1 tablet (50 mg total) by mouth every 6 (six) hours  as needed. 06/24/15   Marlon Pel, PA-C    Family History No family history on file.  Social History Social History  Substance Use Topics  . Smoking status: Current Every Day Smoker    Packs/day: 1.00    Types: Cigarettes  . Smokeless tobacco: Never Used  . Alcohol use No     Allergies   Review of patient's allergies indicates no known allergies.   Review of Systems Review of Systems  Constitutional: Negative for chills, diaphoresis, fatigue and fever.  HENT: Negative for congestion, rhinorrhea and sneezing.   Eyes: Negative.   Respiratory: Negative for cough, chest tightness and shortness of breath.   Cardiovascular: Positive for chest pain. Negative for leg swelling.  Gastrointestinal: Negative for abdominal pain, blood in stool, diarrhea, nausea and vomiting.  Genitourinary: Negative for difficulty urinating, flank pain, frequency and hematuria.  Musculoskeletal: Negative for arthralgias and back pain.  Skin: Negative for rash.  Neurological: Negative for dizziness, speech difficulty, weakness, numbness and headaches.     Physical Exam Updated Vital Signs BP 145/99 (BP Location: Right Arm)   Pulse (!) 56   Temp 98.2 F (36.8 C) (Oral)   Resp 22   SpO2 97%   Physical Exam  Constitutional: He is oriented to person, place, and time. He appears well-developed and well-nourished.  HENT:  Head: Normocephalic and atraumatic.  Eyes: Pupils are equal, round, and reactive to light.  Neck: Normal range of motion. Neck supple.  Cardiovascular: Normal rate, regular rhythm and normal heart sounds.   Pulmonary/Chest: Effort normal and breath sounds normal. No respiratory distress. He has no wheezes. He has no rales. He exhibits no tenderness.  Abdominal: Soft. Bowel sounds are normal. There is no tenderness. There is no rebound and no guarding.  Musculoskeletal: Normal range of motion. He exhibits no edema.  Patient has a tiny, mobile pea-sized cyst like structure in his  left upper arm. It does not appear to be infected. There is no warmth erythema or surrounding swelling.  No edema or calf tenderness  Lymphadenopathy:    He has no cervical adenopathy.  Neurological: He is alert and oriented to person, place, and time.  Skin: Skin is warm and dry. No rash noted.  Psychiatric: He has a normal mood and affect.     ED Treatments / Results  Labs (all labs ordered are listed, but only abnormal results are displayed) Results for orders placed or performed during the hospital encounter of 09/28/15  Basic metabolic panel  Result Value Ref Range   Sodium 141 135 - 145 mmol/L   Potassium 4.2 3.5 - 5.1 mmol/L   Chloride 113 (H) 101 - 111 mmol/L   CO2 24 22 - 32 mmol/L   Glucose, Bld 95 65 - 99 mg/dL   BUN 18 6 - 20 mg/dL   Creatinine, Ser 6.80 0.61 - 1.24 mg/dL   Calcium 9.2 8.9 - 88.1 mg/dL   GFR calc non Af Amer >60 >60 mL/min   GFR calc Af Amer >60 >60 mL/min   Anion gap 4 (L) 5 - 15  CBC  Result Value Ref Range   WBC 7.6 4.0 - 10.5 K/uL   RBC 4.34 4.22 - 5.81 MIL/uL   Hemoglobin 12.9 (L) 13.0 - 17.0 g/dL   HCT 10.3 (L) 15.9 - 45.8 %   MCV 86.9 78.0 - 100.0 fL   MCH 29.7 26.0 - 34.0 pg   MCHC 34.2 30.0 - 36.0 g/dL   RDW 59.2 92.4 - 46.2 %   Platelets 224 150 - 400 K/uL  I-stat troponin, ED  Result Value Ref Range   Troponin i, poc 0.01 0.00 - 0.08 ng/mL   Comment 3          I-stat troponin, ED  Result Value Ref Range   Troponin i, poc 0.01 0.00 - 0.08 ng/mL   Comment 3           Dg Chest 2 View  Result Date: 09/28/2015 CLINICAL DATA:  40 year old male with intermittent left side chest pain for 2 weeks. Initial encounter. Smoker. EXAM: CHEST  2 VIEW COMPARISON:  None. FINDINGS: Mild cardiomegaly. Other mediastinal contours are within normal limits. Visualized tracheal air column is within normal limits. No pneumothorax, pulmonary edema or pleural effusion. Normal lung volumes. No confluent pulmonary opacity. Mild scoliosis. No acute osseous  abnormality identified. IMPRESSION: Mild cardiomegaly.  Otherwise negative. Electronically Signed   By: Odessa Fleming M.D.   On: 09/28/2015 18:46     EKG  EKG Interpretation  Date/Time:  Wednesday September 28 2015 17:12:44 EDT Ventricular Rate:  65 PR Interval:    QRS Duration: 92 QT Interval:  391 QTC Calculation: 407 R Axis:   28 Text Interpretation:  Sinus rhythm Left ventricular hypertrophy No old tracing to compare Confirmed by Marita Burnsed  MD, Tamela Elsayed ([id number]) on 09/28/2015 7:14:40 PM       Radiology Dg Chest 2 View  Result Date: 09/28/2015 CLINICAL DATA:  40 year old male with  intermittent left side chest pain for 2 weeks. Initial encounter. Smoker. EXAM: CHEST  2 VIEW COMPARISON:  None. FINDINGS: Mild cardiomegaly. Other mediastinal contours are within normal limits. Visualized tracheal air column is within normal limits. No pneumothorax, pulmonary edema or pleural effusion. Normal lung volumes. No confluent pulmonary opacity. Mild scoliosis. No acute osseous abnormality identified. IMPRESSION: Mild cardiomegaly.  Otherwise negative. Electronically Signed   By: Odessa Fleming M.D.   On: 09/28/2015 18:46    Procedures Procedures (including critical care time)  Medications Ordered in ED Medications - No data to display   Initial Impression / Assessment and Plan / ED Course  I have reviewed the triage vital signs and the nursing notes.  Pertinent labs & imaging results that were available during my care of the patient were reviewed by me and considered in my medical decision making (see chart for details).  Clinical Course    PT with Presents with atypical chest pain. It's sharp and only last a few seconds. It's been going on at least a couple weeks. He has no associated symptoms. No exertional symptoms. No symptoms that would be more consistent with a pulmonary embolus. He has a low heart score. He was discharged home in good condition. I encouraged him to seek outpatient follow-up. His wife is  going to get him established with [facility]. I've eyes and to keep an eye on his blood pressure and take this to a primary care physician when he follows up. I advised her return here if he has any worsening symptoms. He has a small cystic-like structure in the soft tissue of his left upper arm. It doesn't appear to be infected. I encouraged him to keep an eye on it and if it enlarges see him discuss this with his PCP.  Final Clinical Impressions(s) / ED Diagnoses   Final diagnoses:  Chest pain, unspecified chest pain type    New Prescriptions New Prescriptions   No medications on file     Rolan Bucco, MD 09/28/15 2110

## 2015-09-28 NOTE — ED Notes (Signed)
Patient was alert, oriented and stable upon discharge. RN went over AVS and patient had no further questions.  

## 2015-09-28 NOTE — ED Notes (Signed)
Pt provided with sandwich and coke. Family given water and crackers.

## 2015-09-28 NOTE — ED Triage Notes (Addendum)
Patient presents for intermittent centralized CP (x2 months), described as sharp, lump on right inner elbow (x2 weeks), diarrhea (x2 days), HA (x2 weeks). Denies SOB, N/V, no weakness, no diaphoresis, no fever, no visual changes.

## 2018-09-10 ENCOUNTER — Other Ambulatory Visit: Payer: Self-pay

## 2018-09-10 ENCOUNTER — Ambulatory Visit (HOSPITAL_COMMUNITY)
Admission: EM | Admit: 2018-09-10 | Discharge: 2018-09-10 | Disposition: A | Discharge status: Home / Self Care | Payer: Self-pay | Attending: Emergency Medicine | Admitting: Emergency Medicine

## 2018-09-10 ENCOUNTER — Encounter (HOSPITAL_COMMUNITY): Payer: Self-pay | Admitting: Emergency Medicine

## 2018-09-10 DIAGNOSIS — R42 Dizziness and giddiness: Secondary | ICD-10-CM

## 2018-09-10 MED ORDER — MECLIZINE HCL 12.5 MG PO TABS: 12.5000 mg | ORAL_TABLET | Freq: Three times a day (TID) | ORAL | 0 refills | Status: DC | PRN

## 2018-09-10 NOTE — ED Provider Notes (Addendum)
[facility]    CSN: [id number] Arrival date & time: 09/10/18  0915     History   Chief Complaint Chief Complaint  Patient presents with  . Dizziness    HPI Andrew Whitaker is a 43 y.o. male history of tobacco use presenting today for evaluation of dizziness.  Patient states that his dizziness began yesterday afternoon around 3 PM.  States that it started after he had been working out in the heat in his yard digging holes with a shovel.  He felt lightheaded and faint.  He laid down for the rest of the evening and his symptoms have slightly improved.  He drank some Gatorade last night.  He is also noticed a slight numb sensation in his left hand and left shoulder pain.  He notes that he is right-handed.  Denies weakness on one side.  Denies any headaches associated with this, had a few headaches a few days prior.  Denies changes in vision.  Denies difficulty speaking or confusion.  Has had some mild nausea.  Occasionally has a moving sensation with changing position.  Denies recent illness or fevers.  Denies changes in hearing.  HPI  History reviewed. No pertinent past medical history.  There are no active problems to display for this patient.   History reviewed. No pertinent surgical history.     Home Medications    Prior to Admission medications   Medication Sig Start Date End Date Taking? Authorizing Provider  meclizine (ANTIVERT) 12.5 MG tablet Take 1 tablet (12.5 mg total) by mouth 3 (three) times daily as needed for dizziness. 09/10/18   Tomeko Scoville, Elesa Hacker, PA-C    Family History Family History  Problem Relation Age of Onset  . Cancer Mother   . Hypertension Father     Social History Social History   Tobacco Use  . Smoking status: Current Every Day Smoker    Packs/day: 1.00    Types: Cigarettes  . Smokeless tobacco: Never Used  Substance Use Topics  . Alcohol use: No  . Drug use: No     Allergies   Patient has no known allergies.   Review of  Systems Review of Systems  Constitutional: Negative for fatigue and fever.  HENT: Negative for congestion, sinus pressure and sore throat.   Eyes: Negative for photophobia, pain and visual disturbance.  Respiratory: Negative for cough and shortness of breath.   Cardiovascular: Negative for chest pain.  Gastrointestinal: Negative for abdominal pain, nausea and vomiting.  Genitourinary: Negative for decreased urine volume and hematuria.  Musculoskeletal: Negative for myalgias, neck pain and neck stiffness.  Neurological: Positive for dizziness and light-headedness. Negative for syncope, facial asymmetry, speech difficulty, weakness, numbness and headaches.     Physical Exam Triage Vital Signs ED Triage Vitals  Enc Vitals Group     BP 09/10/18 0947 (!) 167/99     Pulse Rate 09/10/18 0947 (!) 58     Resp 09/10/18 0947 18     Temp 09/10/18 0947 98.3 F (36.8 C)     Temp Source 09/10/18 0947 Oral     SpO2 09/10/18 0947 100 %     Weight --      Height --      Head Circumference --      Peak Flow --      Pain Score 09/10/18 0942 0     Pain Loc --      Pain Edu? --      Excl. in [place]? --  No data found.  Updated Vital Signs BP (!) 167/99 (BP Location: Left Arm) Comment (BP Location): large cuff  Pulse (!) 58   Temp 98.3 F (36.8 C) (Oral)   Resp 18   SpO2 100%   Lying - 168/114 Sitting- 173/122 Standing- 171/117  Visual Acuity Right Eye Distance:   Left Eye Distance:   Bilateral Distance:    Right Eye Near:   Left Eye Near:    Bilateral Near:     Physical Exam Vitals signs and nursing note reviewed.  Constitutional:      Appearance: He is well-developed.  HENT:     Head: Normocephalic and atraumatic.     Ears:     Comments: Bilateral ears without tenderness to palpation of external auricle, tragus and mastoid, EAC's without erythema or swelling, TM's with good bony landmarks and cone of light. Non erythematous.     Mouth/Throat:     Comments: Oral mucosa pink  and moist, no tonsillar enlargement or exudate. Posterior pharynx patent and nonerythematous, no uvula deviation or swelling. Normal phonation. Palate elevates symmetrically Eyes:     Extraocular Movements: Extraocular movements intact.     Conjunctiva/sclera: Conjunctivae normal.     Pupils: Pupils are equal, round, and reactive to light.  Neck:     Musculoskeletal: Neck supple.  Cardiovascular:     Rate and Rhythm: Normal rate and regular rhythm.     Heart sounds: No murmur.  Pulmonary:     Effort: Pulmonary effort is normal. No respiratory distress.     Breath sounds: Normal breath sounds.     Comments: Breathing comfortably at rest, CTABL, no wheezing, rales or other adventitious sounds auscultated Abdominal:     Palpations: Abdomen is soft.     Tenderness: There is no abdominal tenderness.  Skin:    General: Skin is warm and dry.  Neurological:     General: No focal deficit present.     Mental Status: He is alert and oriented to person, place, and time. Mental status is at baseline.     Comments: Patient A&O x3, cranial nerves II-XII grossly intact, strength at shoulders, hips and knees 5/5, equal bilaterally, patellar reflex 2+ bilaterally. Gait without abnormality. Negative Dix-Hallpike bilaterally      UC Treatments / Results  Labs (all labs ordered are listed, but only abnormal results are displayed) Labs Reviewed - No data to display  EKG   Radiology No results found.  Procedures Procedures (including critical care time)  Medications Ordered in UC Medications - No data to display  Initial Impression / Assessment and Plan / UC Course  I have reviewed the triage vital signs and the nursing notes.  Pertinent labs & imaging results that were available during my care of the patient were reviewed by me and considered in my medical decision making (see chart for details).     Recommended EKG, BMP, CBC, patient declined given concern for cost.  Stressed  importance of EKG given his elevated blood pressure and associated left arm symptoms.  He does not have any chest pain.  EKG with nonspecific T wave inversions in lead II, III and aVF.  Has previous inversion in lead III, but nonspecific inversions in 2 and aVF do appear new.  No acute signs of ischemia or infarction. No currently with chest pain or shortness of breath. Discussed with Dr. Tracie HarrierHagler,  Advised to follow-up in emergency room if developing any chest pain or worsening symptoms.  Advised once his insurance begins in  a month to establish care with primary care/cardiology for further evaluation of his heart.     Will recommend oral rehydration to help with any dehydration contributing to symptoms and recommend pushing fluids. Continue to monitor blood pressure.Discussed strict return precautions. Patient verbalized understanding and is agreeable with plan.  Final Clinical Impressions(s) / UC Diagnoses   Final diagnoses:  Dizziness     Discharge Instructions     Please drink plenty of fluids over the next week Rest and avoid exertion until symptoms return to normal Avoid quick movements  Follow up if symptoms worsening/changing  Your blood pressure was elevated today in clinic. Please try to monitor this at home.  Please go to Emergency Room if you start to experience severe headache, vision changes, decreased urine production, chest pain, shortness of breath, speech slurring, one sided weakness.    ED Prescriptions    Medication Sig Dispense Auth. Provider   meclizine (ANTIVERT) 12.5 MG tablet Take 1 tablet (12.5 mg total) by mouth 3 (three) times daily as needed for dizziness. 20 tablet Latish Toutant, [place]Hallie C, PA-C     Controlled Substance Prescriptions [place] Controlled Substance Registry consulted? Not Applicable   Lew DawesWieters, Nicolina Hirt C, PA-C 09/10/18 [address][id number]    Joyceann Kruser, MarshallHallie C, [place]PA-C 09/10/18 1129

## 2018-09-10 NOTE — ED Triage Notes (Signed)
Feeling dizzy and nauseated since yesterday evening.  Patient complains of room not spinning, but things moving.   and hands and feet are numb.  Feeling better than last night

## 2018-09-10 NOTE — Discharge Instructions (Addendum)
Please drink plenty of fluids over the next week Rest and avoid exertion until symptoms return to normal Avoid quick movements  Follow up if symptoms worsening/changing  Your blood pressure was elevated today in clinic. Please try to monitor this at home.  Please go to Emergency Room if you start to experience severe headache, vision changes, decreased urine production, chest pain, shortness of breath, speech slurring, one sided weakness.

## 2019-11-25 ENCOUNTER — Emergency Department (HOSPITAL_COMMUNITY): Payer: BC Managed Care – PPO

## 2019-11-25 ENCOUNTER — Other Ambulatory Visit: Payer: Self-pay

## 2019-11-25 ENCOUNTER — Emergency Department (HOSPITAL_COMMUNITY)
Admission: EM | Admit: 2019-11-25 | Discharge: 2019-11-25 | Disposition: A | Discharge status: Home / Self Care | Payer: BC Managed Care – PPO | Attending: Emergency Medicine | Admitting: Emergency Medicine

## 2019-11-25 ENCOUNTER — Encounter (HOSPITAL_COMMUNITY): Payer: Self-pay

## 2019-11-25 DIAGNOSIS — I10 Essential (primary) hypertension: Secondary | ICD-10-CM | POA: Diagnosis not present

## 2019-11-25 DIAGNOSIS — R079 Chest pain, unspecified: Secondary | ICD-10-CM | POA: Diagnosis present

## 2019-11-25 DIAGNOSIS — F17210 Nicotine dependence, cigarettes, uncomplicated: Secondary | ICD-10-CM | POA: Insufficient documentation

## 2019-11-25 DIAGNOSIS — R0789 Other chest pain: Secondary | ICD-10-CM | POA: Insufficient documentation

## 2019-11-25 HISTORY — DX: Essential (primary) hypertension: I10

## 2019-11-25 LAB — CBC
HCT: 42.5 % (ref 39.0–52.0)
Hemoglobin: 13.8 g/dL (ref 13.0–17.0)
MCH: 28.5 pg (ref 26.0–34.0)
MCHC: 32.5 g/dL (ref 30.0–36.0)
MCV: 87.6 fL (ref 80.0–100.0)
Platelets: 236 10*3/uL (ref 150–400)
RBC: 4.85 MIL/uL (ref 4.22–5.81)
RDW: 12.5 % (ref 11.5–15.5)
WBC: 6.4 10*3/uL (ref 4.0–10.5)
nRBC: 0 % (ref 0.0–0.2)

## 2019-11-25 LAB — BASIC METABOLIC PANEL
Anion gap: 9 (ref 5–15)
BUN: 17 mg/dL (ref 6–20)
CO2: 23 mmol/L (ref 22–32)
Calcium: 9.4 mg/dL (ref 8.9–10.3)
Chloride: 107 mmol/L (ref 98–111)
Creatinine, Ser: 1.15 mg/dL (ref 0.61–1.24)
GFR calc Af Amer: >60 mL/min (ref >60)
GFR calc non Af Amer: >60 mL/min (ref >60)
Glucose, Bld: 111 mg/dL — ABNORMAL HIGH (ref 70–99)
Potassium: 3.8 mmol/L (ref 3.5–5.1)
Sodium: 139 mmol/L (ref 135–145)

## 2019-11-25 LAB — TROPONIN I (HIGH SENSITIVITY)
Troponin I (High Sensitivity): 18 ng/L — ABNORMAL HIGH (ref <18)
Troponin I (High Sensitivity): 10 ng/L (ref <18)

## 2019-11-25 NOTE — ED Provider Notes (Signed)
[facility] EMERGENCY DEPARTMENT Provider Note   CSN: [id number] Arrival date & time: 11/25/19  0745     History Chief Complaint  Patient presents with  . Chest Pain    Andrew Whitaker is a 44 y.o. male.  HPI  HPI: A 44 year old patient with a history of hypertension and obesity presents for evaluation of chest pain. Initial onset of pain was approximately 3-6 hours ago. The patient's chest pain is sharp and is not worse with exertion. The patient's chest pain is middle- or left-sided, is not well-localized, is not described as heaviness/pressure/tightness and does radiate to the arms/jaw/neck. The patient does not complain of nausea and denies diaphoresis. The patient has smoked in the past 90 days. The patient has no history of stroke, has no history of peripheral artery disease, denies any history of treated diabetes, has no relevant family history of coronary artery disease (first degree relative at less than age 51) and has no history of hypercholesterolemia.    Andrew Whitaker is a 44 y.o. male, with a history of HTN, presenting to the ED with chest pain intermittent over the last week.  He states the pain is in the left chest, sharp, "feels like a twinge," moderate in intensity, lasts for a couple seconds at a time.  Sometimes he has back pain along with it and sometimes he also experiences some momentary shortness of breath.  The symptoms do not seem to be associated with exertion. He has not had this discomfort since ED arrival.  He states he recently established with a PCP at [facility].  Last week, he was placed on antihypertensive medication, 25 mg daily, which he thinks may be losartan. Denies history of PE/DVT, recent travel, recent surgery, recent trauma. He denies fever/chills, cough, N/V/D, abdominal pain, neurologic deficits, syncope, lower extremity edema/pain, or any other complaints.    Past Medical History:  Diagnosis Date  .  Hypertension     There are no problems to display for this patient.   History reviewed. No pertinent surgical history.     Family History  Problem Relation Age of Onset  . Cancer Mother   . Hypertension Father     Social History   Tobacco Use  . Smoking status: Current Every Day Smoker    Packs/day: 1.00    Types: Cigarettes  . Smokeless tobacco: Never Used  Substance Use Topics  . Alcohol use: No  . Drug use: No    Home Medications Prior to Admission medications   Medication Sig Start Date End Date Taking? Authorizing Provider  meclizine (ANTIVERT) 12.5 MG tablet Take 1 tablet (12.5 mg total) by mouth 3 (three) times daily as needed for dizziness. 09/10/18   Wieters, Hallie C, PA-C    Allergies    Patient has no known allergies.  Review of Systems   Review of Systems  Constitutional: Negative for chills, diaphoresis and fever.  Respiratory: Negative for cough and shortness of breath.   Cardiovascular: Positive for chest pain. Negative for leg swelling.  Gastrointestinal: Negative for abdominal pain, diarrhea, nausea and vomiting.  Neurological: Negative for dizziness, syncope, weakness and numbness.  All other systems reviewed and are negative.   Physical Exam Updated Vital Signs BP (!) 160/110 (BP Location: Left Arm)   Pulse 65   Temp 98.1 F (36.7 C) (Oral)   Resp 17   Ht 6\' 1"  (1.854 m)   Wt 103.4 kg   SpO2 100%   BMI 30.08 kg/m  Physical Exam Vitals and nursing note reviewed.  Constitutional:      General: He is not in acute distress.    Appearance: He is well-developed. He is not diaphoretic.  HENT:     Head: Normocephalic and atraumatic.     Mouth/Throat:     Mouth: Mucous membranes are moist.     Pharynx: Oropharynx is clear.  Eyes:     Conjunctiva/sclera: Conjunctivae normal.  Cardiovascular:     Rate and Rhythm: Normal rate and regular rhythm.     Pulses: Normal pulses.          Radial pulses are 2+ on the right side and 2+ on the  left side.       Posterior tibial pulses are 2+ on the right side and 2+ on the left side.     Heart sounds: Normal heart sounds.     Comments: Tactile temperature in the extremities appropriate and equal bilaterally. Pulmonary:     Effort: Pulmonary effort is normal. No respiratory distress.     Breath sounds: Normal breath sounds.  Abdominal:     Palpations: Abdomen is soft.     Tenderness: There is no abdominal tenderness. There is no guarding.  Musculoskeletal:     Cervical back: Neck supple.     Right lower leg: No edema.     Left lower leg: No edema.  Lymphadenopathy:     Cervical: No cervical adenopathy.  Skin:    General: Skin is warm and dry.  Neurological:     Mental Status: He is alert.     Comments: No noted acute cognitive deficit. Sensation grossly intact to light touch in the extremities.   Grip strengths equal bilaterally.   Strength 5/5 in all extremities.  No gait disturbance.  Coordination intact.  Cranial nerves III-XII grossly intact.  Handles oral secretions without noted difficulty.  No noted phonation or speech deficit. No facial droop.   Psychiatric:        Mood and Affect: Mood and affect normal.        Speech: Speech normal.        Behavior: Behavior normal.     ED Results / Procedures / Treatments   Labs (all labs ordered are listed, but only abnormal results are displayed) Labs Reviewed  BASIC METABOLIC PANEL - Abnormal; Notable for the following components:      Result Value   Glucose, Bld 111 (*)    All other components within normal limits  TROPONIN I (HIGH SENSITIVITY) - Abnormal; Notable for the following components:   Troponin I (High Sensitivity) 18 (*)    All other components within normal limits  RESPIRATORY PANEL BY RT PCR (FLU A&B, COVID)  CBC  TROPONIN I (HIGH SENSITIVITY)    EKG EKG Interpretation  Date/Time:  Wednesday November 25 2019 07:52:11 EDT Ventricular Rate:  69 PR Interval:  146 QRS Duration: 92 QT  Interval:  414 QTC Calculation: 443 R Axis:   -23 Text Interpretation: Normal sinus rhythm Left ventricular hypertrophy ( R in aVL , Sokolow-Lyon , Cornell product ) Non-specific ST-t changes Baseline wander Confirmed by Cathren Laine ([id number]) on 11/25/2019 12:58:51 PM   Radiology DG Chest 2 View  Result Date: 11/25/2019 CLINICAL DATA:  Chest pain, moving in the LEFT arm with shortness of breath and cough for greater than 1 month EXAM: CHEST - 2 VIEW COMPARISON:  September 28, 2015 FINDINGS: Trachea midline. Cardiomediastinal contours and hilar structures are normal. Linear opacity along the LEFT heart  border, no lobar consolidation. No effusion. On limited assessment no acute skeletal process. IMPRESSION: Subtle opacity at the LEFT lung base may represent atelectasis or scarring. Consider follow-up PA and lateral chest after resolution of symptoms. Electronically Signed   By: Donzetta Kohut M.D.   On: 11/25/2019 08:13    Procedures Procedures (including critical care time)  Medications Ordered in ED Medications - No data to display  ED Course  I have reviewed the triage vital signs and the nursing notes.  Pertinent labs & imaging results that were available during my care of the patient were reviewed by me and considered in my medical decision making (see chart for details).    MDM Rules/Calculators/A&P HEAR Score: 3                         EKG without evidence of acute ischemia or pathologic/symptomatic arrhythmia.  Nonspecific ST changes on EKG previously noted. Troponins 18 and 10. Wells criteria score is 0, indicating low risk for PE.  PERC negative. Dissection was considered, but thought less likely base on: History and description of the pain are not suggestive, patient is not ill-appearing, lack of risk factors, equal bilateral pulses, lack of neurologic deficits, no widened mediastinum on chest x-ray.  I reviewed and interpreted the patient's labs and radiological studies.  We  recommended follow-up with cardiology on the matter of his chest discomfort. We discussed the possible, slight abnormality on the chest x-ray.  We recommended he secure a repeat x-ray with his PCP.  We advised the patient to discuss his hypertension with his PCP.  Discussed and counseled on smoking cessation.  Findings and plan of care discussed with Cathren Laine, MD. Dr. Denton Lank personally evaluated and examined this patient.   Vitals:   11/25/19 1315 11/25/19 1330 11/25/19 1345 11/25/19 1351  BP: (!) 168/100 (!) 157/98 (!) 155/100   Pulse: 73 (!) 57 60   Resp: 18 18 18    Temp:    97.8 F (36.6 C)  TempSrc:      SpO2: 100% 99% 99%   Weight:      Height:         Final Clinical Impression(s) / ED Diagnoses Final diagnoses:  Atypical chest pain    Rx / DC Orders ED Discharge Orders    None       11/25/19 1457    11/27/19, MD 11/26/19 1609

## 2019-11-25 NOTE — ED Notes (Signed)
Patient verbalizes understanding of discharge instructions. Opportunity for questioning and answers were provided. Arm band removed by staff, patient discharged from ED. 

## 2019-11-25 NOTE — Discharge Instructions (Addendum)
Work-up today was overall reassuring. We recommend follow-up with cardiology on this matter.  Call the office to make an appointment.  Additionally, we noted a possible slight abnormality to the left lower lung.  When the symptoms things are found, it is a good idea to have a repeat chest x-ray done after symptoms resolve or within the next month.  We recommend smoking cessation.  Be sure to discuss this with your primary doctor.   Discuss your high blood pressure with your doctor as well.  Test Results for COVID-19 pending  You have a test pending for COVID-19.  Results typically return within about 48 hours.  Be sure to check MyChart for updated results.  We recommend isolating yourself until results are received.  Patients who have symptoms consistent with COVID-19 should self isolated for: At least 3 days (72 hours) have passed since recovery, defined as resolution of fever without the use of fever reducing medications and improvement in respiratory symptoms (e.g., cough, shortness of breath), and At least 7 days have passed since symptoms first appeared.  If you have no symptoms, but your test returns positive, recommend isolating for at least 10 days.   Return to the emergency department for persistent chest pain, shortness of breath, dizziness, passing out, abdominal pain, or any other major concerns.

## 2019-11-25 NOTE — ED Triage Notes (Signed)
Patient complains of chest pain with lower back pain x 2 days. Patient states that the pain worse with inspiration and movement. No respiratory complaints. Started on BP meds last week

## 2020-02-23 ENCOUNTER — Other Ambulatory Visit (HOSPITAL_BASED_OUTPATIENT_CLINIC_OR_DEPARTMENT_OTHER): Payer: Self-pay

## 2020-02-23 DIAGNOSIS — R0683 Snoring: Secondary | ICD-10-CM

## 2020-03-08 ENCOUNTER — Ambulatory Visit (HOSPITAL_BASED_OUTPATIENT_CLINIC_OR_DEPARTMENT_OTHER): Payer: BC Managed Care – PPO | Attending: Internal Medicine | Admitting: Internal Medicine

## 2020-04-17 ENCOUNTER — Other Ambulatory Visit: Payer: Self-pay

## 2020-04-17 ENCOUNTER — Encounter (HOSPITAL_COMMUNITY): Payer: Self-pay | Admitting: *Deleted

## 2020-04-17 ENCOUNTER — Ambulatory Visit (HOSPITAL_COMMUNITY)
Admission: EM | Admit: 2020-04-17 | Discharge: 2020-04-17 | Disposition: A | Discharge status: Other Acute Inpatient Hospital | Payer: BC Managed Care – PPO | Attending: Urgent Care | Admitting: Urgent Care

## 2020-04-17 DIAGNOSIS — R0789 Other chest pain: Secondary | ICD-10-CM

## 2020-04-17 DIAGNOSIS — I10 Essential (primary) hypertension: Secondary | ICD-10-CM

## 2020-04-17 MED ORDER — ASPIRIN 81 MG PO CHEW
324.0000 mg | CHEWABLE_TABLET | Freq: Once | ORAL | Status: AC
  Administered 2020-04-17: 324 mg via ORAL

## 2020-04-17 MED ORDER — HYDROCODONE-ACETAMINOPHEN 5-325 MG PO TABS
ORAL_TABLET | ORAL | Status: AC
  Filled 2020-04-17: qty 1

## 2020-04-17 MED ORDER — HYDROCODONE-ACETAMINOPHEN 5-325 MG PO TABS
1.0000 | ORAL_TABLET | Freq: Once | ORAL | Status: AC
  Administered 2020-04-17: 1 via ORAL

## 2020-04-17 MED ORDER — ASPIRIN 81 MG PO CHEW
CHEWABLE_TABLET | ORAL | Status: AC
  Filled 2020-04-17: qty 4

## 2020-04-17 NOTE — Discharge Instructions (Signed)
You are in need of further evaluation that we could provide in the urgent care setting to make sure that your heart is doing okay.  Your chest pain could be due to a heart attack.  Currently there is no sign of this in your EKG but given your medical history including elevated troponins, smoking history and blood pressure he have to make sure that you get to the emergency room now.

## 2020-04-17 NOTE — ED Triage Notes (Addendum)
Pt reports LT CP that comes and goes for a week . CP radiates into Lt arm. Pt has SHOB and feels light headed.PT yawing during intake.

## 2020-04-17 NOTE — ED Provider Notes (Signed)
[facility]   MRN: [id number] DOB: April 05, 1975  Subjective:   Andrew Whitaker is a 45 y.o. male with past medical history of hypertension, history of elevated troponins presenting for 1 week history of intermittent moderate to severe mid to left-sided chest pain that is moderate in nature, radiates into the left arm.  Has felt some weakness and shortness of breath, lightheadedness as well.  Patient has risk factors of hypertension, smoking history of 1-2 packs/day.  Recently quit smoking about 4 months ago.  Denies fever, headache, confusion, diaphoresis, neck or jaw pain, abdominal pain.  Denies history of an MI but has had elevated troponins in the past, last seen on November 25, 2019.  No drug use.  No current facility-administered medications for this encounter.  Current Outpatient Medications:  .  meclizine (ANTIVERT) 12.5 MG tablet, Take 1 tablet (12.5 mg total) by mouth 3 (three) times daily as needed for dizziness., Disp: 20 tablet, Rfl: 0   No Known Allergies  Past Medical History:  Diagnosis Date  . Hypertension      No past surgical history on file.  Family History  Problem Relation Age of Onset  . Cancer Mother   . Hypertension Father     Social History   Tobacco Use  . Smoking status: Current Every Day Smoker    Packs/day: 1.00    Types: Cigarettes  . Smokeless tobacco: Never Used  Substance Use Topics  . Alcohol use: No  . Drug use: No    ROS   Objective:   Vitals: BP (!) 148/90 (BP Location: Right Arm)   Pulse 67   Temp 98 F (36.7 C) (Oral)   Resp 18   SpO2 100%   Physical Exam Constitutional:      General: He is not in acute distress.    Appearance: Normal appearance. He is well-developed. He is ill-appearing. He is not toxic-appearing or diaphoretic.  HENT:     Head: Normocephalic and atraumatic.     Right Ear: External ear normal.     Left Ear: External ear normal.     Nose: Nose normal.     Mouth/Throat:     Mouth:  Mucous membranes are moist.     Pharynx: Oropharynx is clear.  Eyes:     General: No scleral icterus.    Extraocular Movements: Extraocular movements intact.     Pupils: Pupils are equal, round, and reactive to light.  Cardiovascular:     Rate and Rhythm: Normal rate and regular rhythm.     Heart sounds: Normal heart sounds. No murmur heard. No friction rub. No gallop.   Pulmonary:     Effort: Pulmonary effort is normal. No respiratory distress.     Breath sounds: Normal breath sounds. No stridor. No wheezing, rhonchi or rales.  Neurological:     Mental Status: He is alert and oriented to person, place, and time.  Psychiatric:        Mood and Affect: Mood normal.        Behavior: Behavior normal.        Thought Content: Thought content normal.     ED ECG REPORT   Date: 04/17/2020  Rate: 69 bpm  Rhythm: normal sinus rhythm  QRS Axis: normal  Intervals: normal  ST/T Wave abnormalities: T wave inversion in lead III, flattening in lead II and aVF  Conduction Disutrbances:none  Narrative Interpretation: Sinus rhythm at 69 bpm with T wave changes as above.  Slightly different from  previous EKGs.  Old EKG Reviewed: changes noted  I have personally reviewed the EKG tracing and agree with the computerized printout as noted.   Assessment and Plan :   PDMP not reviewed this encounter.  1. Atypical chest pain   2. Essential hypertension     Patient has concerning symptoms for ACS.  Does not want transport to the hospital by ambulance as his wife is here and can drive him there.  No acute findings showing ACS on his EKG.  Therefore I was agreeable to transport by personal vehicle.  He was given 324 mg of chewable aspirin, hydrocodone in clinic (for his pain).  Contracts for safety and reports that he is going to go to the emergency room now.   Wallis Bamberg, PA-C 04/17/20 1346

## 2020-10-13 DIAGNOSIS — R7303 Prediabetes: Secondary | ICD-10-CM | POA: Diagnosis not present

## 2020-10-13 DIAGNOSIS — Z0001 Encounter for general adult medical examination with abnormal findings: Secondary | ICD-10-CM | POA: Diagnosis not present

## 2020-10-13 DIAGNOSIS — I10 Essential (primary) hypertension: Secondary | ICD-10-CM | POA: Diagnosis not present

## 2020-10-13 DIAGNOSIS — Z720 Tobacco use: Secondary | ICD-10-CM | POA: Diagnosis not present

## 2020-10-13 DIAGNOSIS — N528 Other male erectile dysfunction: Secondary | ICD-10-CM | POA: Diagnosis not present

## 2020-10-13 DIAGNOSIS — Z125 Encounter for screening for malignant neoplasm of prostate: Secondary | ICD-10-CM | POA: Diagnosis not present

## 2020-11-13 DIAGNOSIS — L03012 Cellulitis of left finger: Secondary | ICD-10-CM | POA: Diagnosis not present

## 2020-12-12 DIAGNOSIS — M545 Low back pain, unspecified: Secondary | ICD-10-CM | POA: Diagnosis not present

## 2021-10-06 IMAGING — CR DG CHEST 2V
2 series · 2 of 2 positions shown · non-contrast
Comparison: September 28, 2015

CLINICAL DATA: Chest pain, moving in the LEFT arm with shortness of
breath and cough for greater than 1 month

EXAM:
CHEST - 2 VIEW

[chest pa]
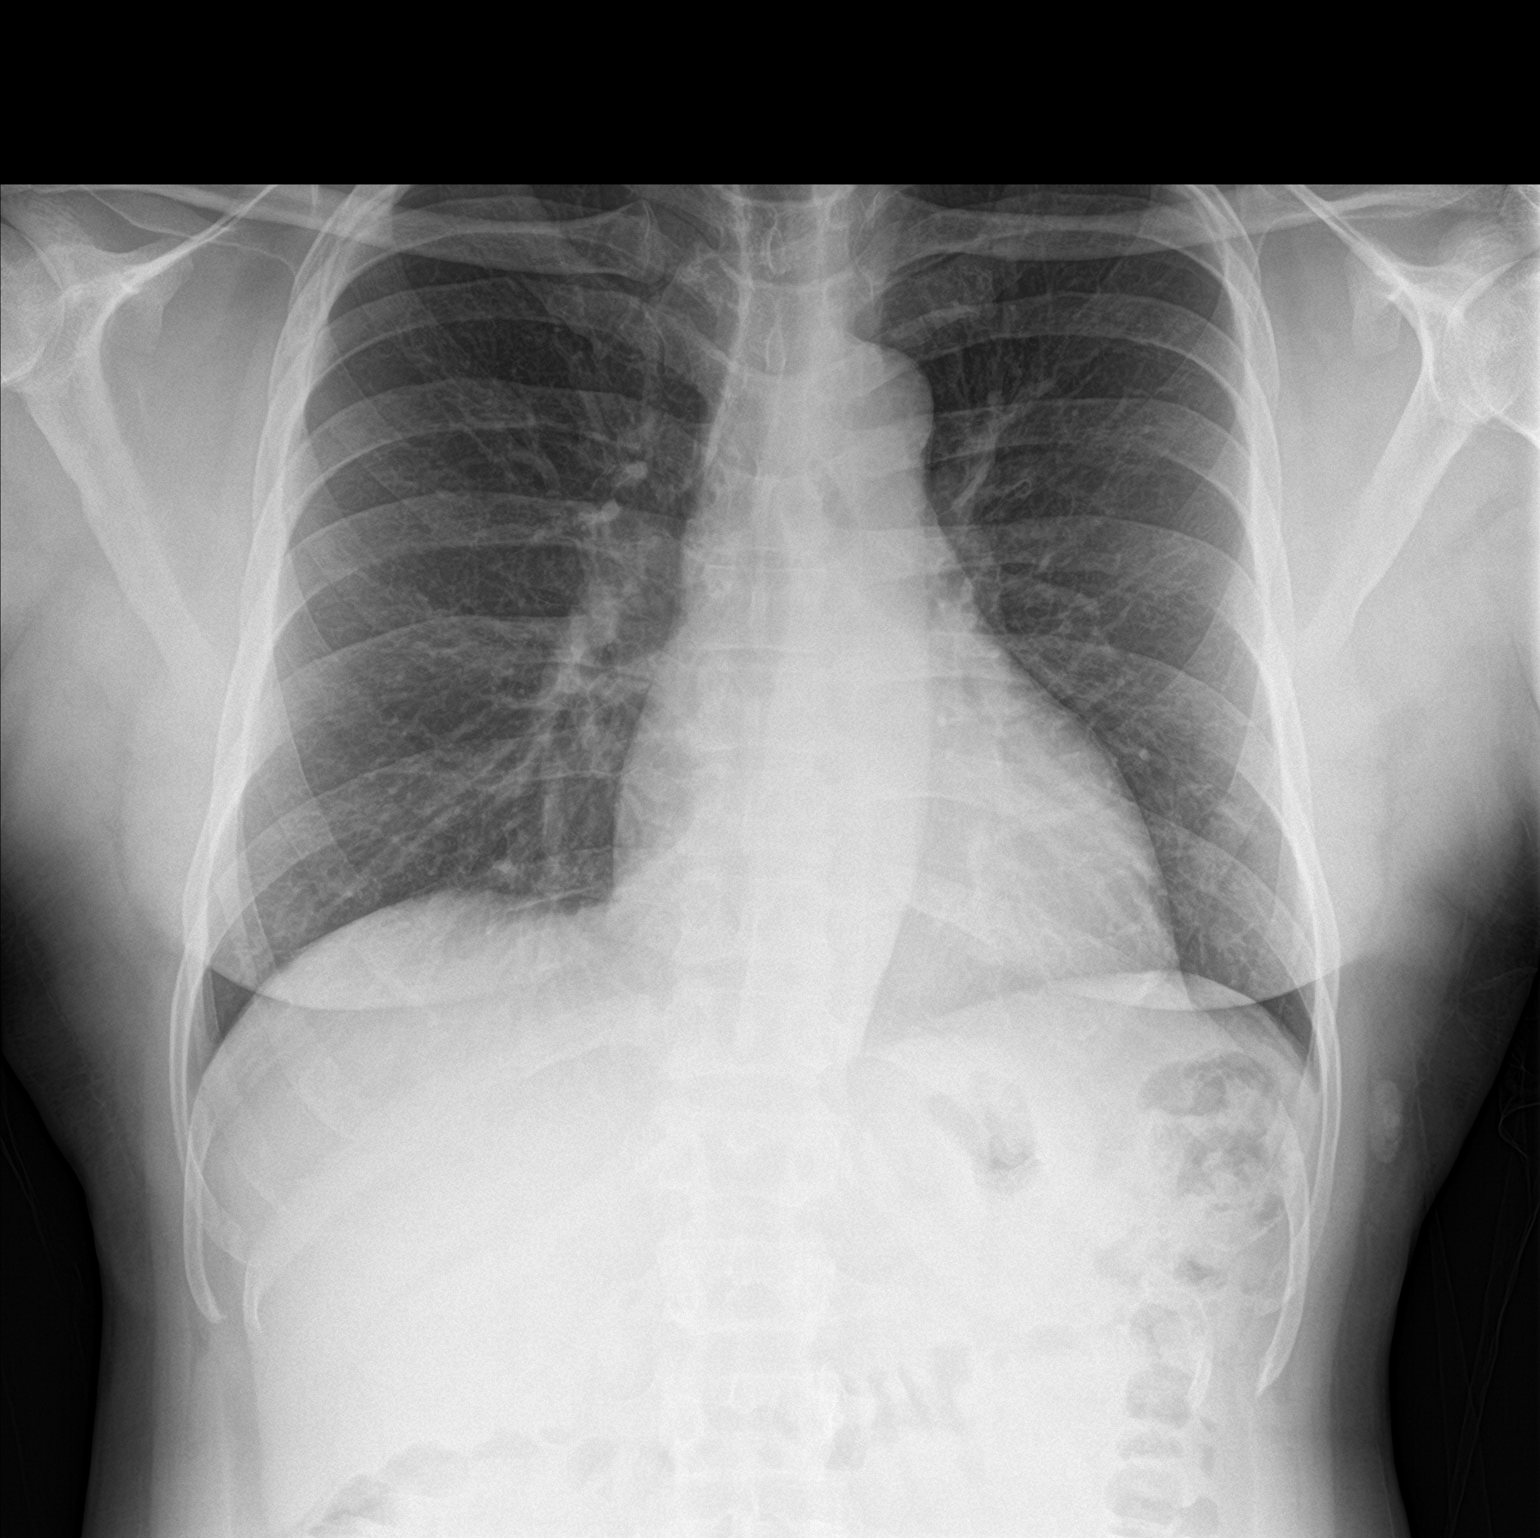

[chest lat]
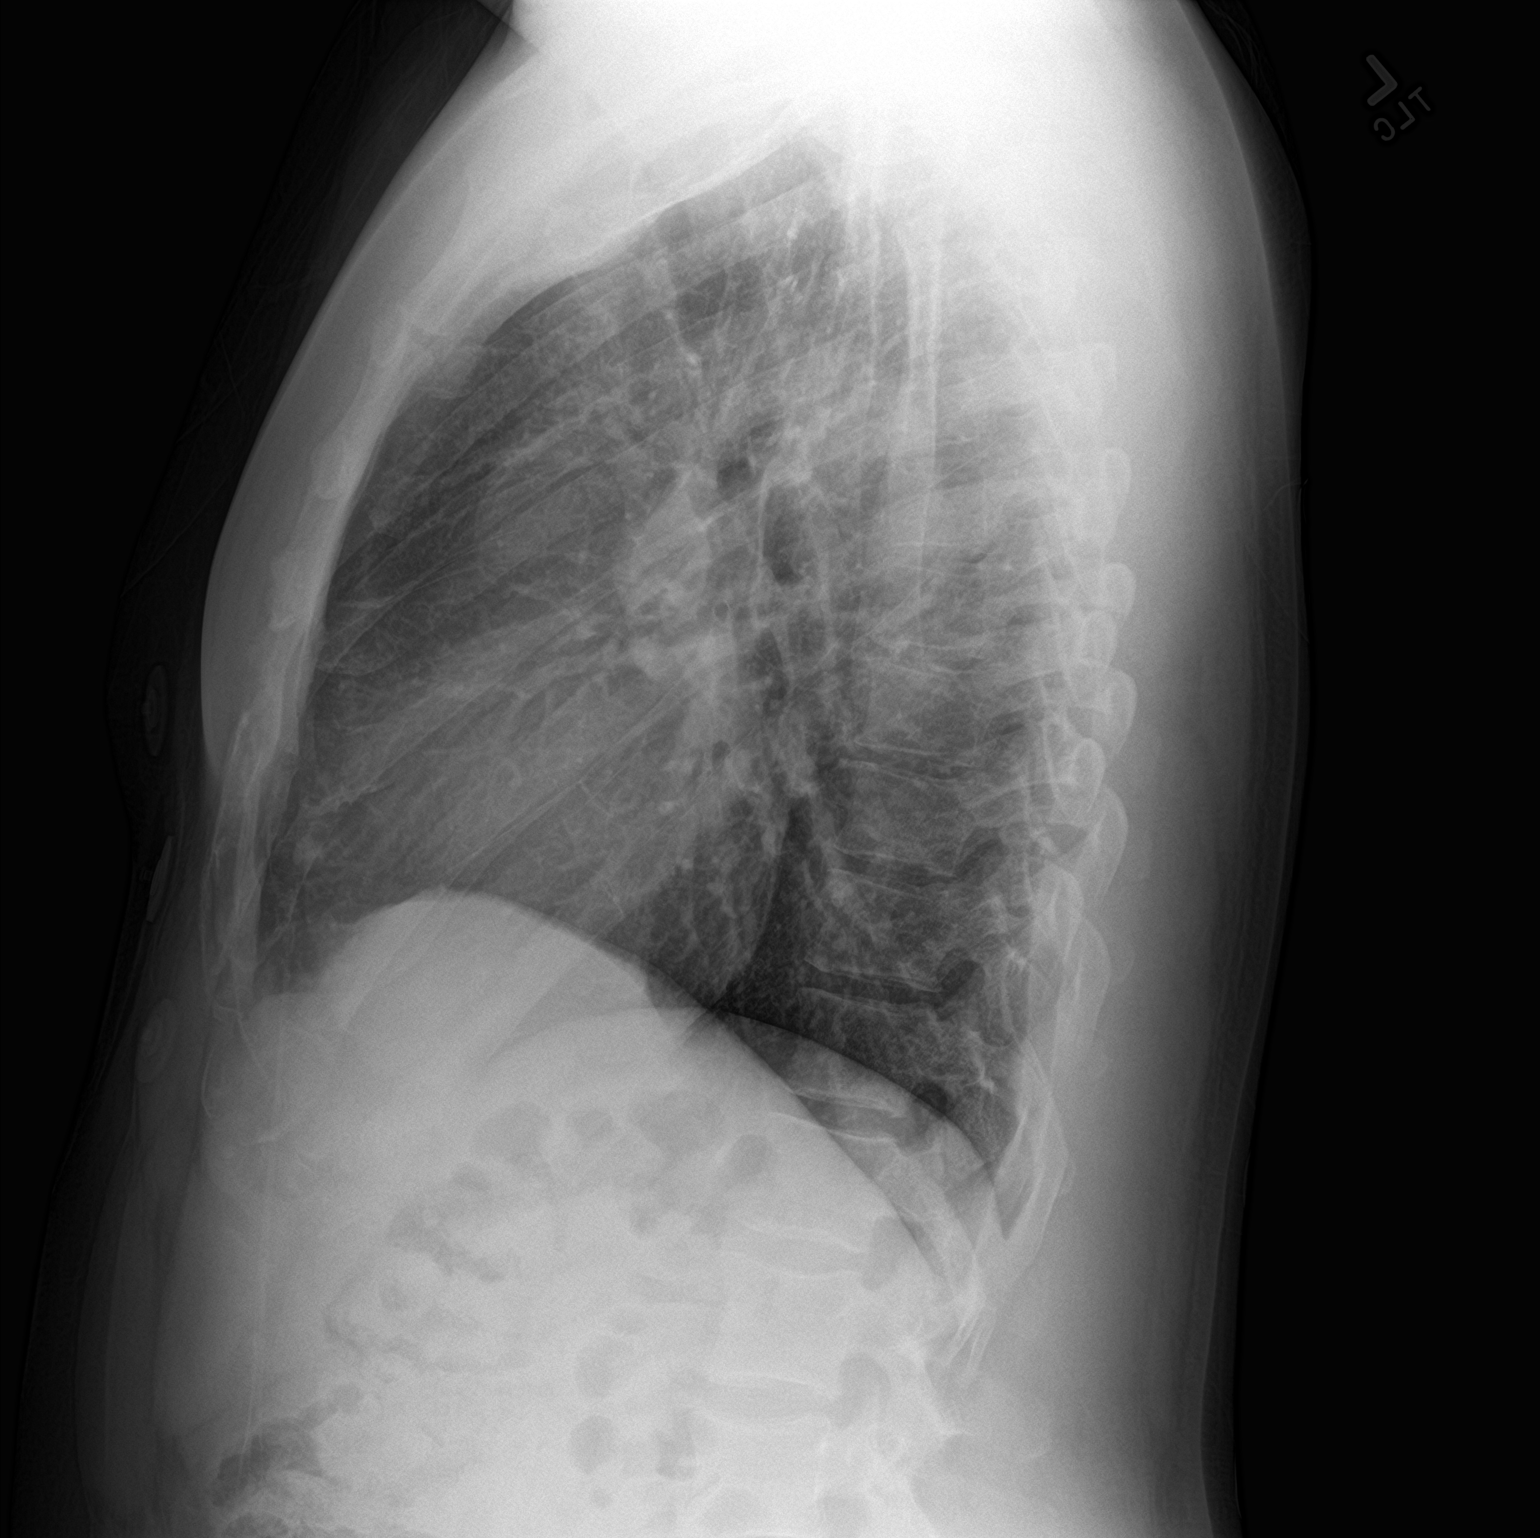

[2 of 2 positions shown; findings below may reference images not displayed]

FINDINGS: Trachea midline. Cardiomediastinal contours and hilar structures are
normal.

Linear opacity along the LEFT heart border, no lobar consolidation.
No effusion. On limited assessment no acute skeletal process.
IMPRESSION: Subtle opacity at the LEFT lung base may represent atelectasis or
scarring. Consider follow-up PA and lateral chest after resolution
of symptoms.

## 2023-02-08 ENCOUNTER — Institutional Professional Consult (permissible substitution): Payer: BC Managed Care – PPO | Admitting: Primary Care

## 2023-03-13 ENCOUNTER — Institutional Professional Consult (permissible substitution): Payer: BC Managed Care – PPO | Admitting: Nurse Practitioner

## 2023-04-03 ENCOUNTER — Institutional Professional Consult (permissible substitution): Payer: BC Managed Care – PPO | Admitting: Nurse Practitioner

## 2023-04-03 ENCOUNTER — Encounter: Payer: Self-pay | Admitting: Student

## 2023-11-06 ENCOUNTER — Emergency Department (HOSPITAL_COMMUNITY)
Admission: EM | Admit: 2023-11-06 | Discharge: 2023-11-06 | Disposition: A | Discharge status: Home / Self Care | Attending: Student in an Organized Health Care Education/Training Program | Admitting: Student in an Organized Health Care Education/Training Program

## 2023-11-06 ENCOUNTER — Other Ambulatory Visit: Payer: Self-pay

## 2023-11-06 ENCOUNTER — Emergency Department (HOSPITAL_COMMUNITY)

## 2023-11-06 ENCOUNTER — Encounter (HOSPITAL_COMMUNITY): Payer: Self-pay | Admitting: Emergency Medicine

## 2023-11-06 DIAGNOSIS — N3090 Cystitis, unspecified without hematuria: Secondary | ICD-10-CM | POA: Diagnosis not present

## 2023-11-06 DIAGNOSIS — N451 Epididymitis: Secondary | ICD-10-CM | POA: Insufficient documentation

## 2023-11-06 DIAGNOSIS — D72829 Elevated white blood cell count, unspecified: Secondary | ICD-10-CM | POA: Insufficient documentation

## 2023-11-06 DIAGNOSIS — N50812 Left testicular pain: Secondary | ICD-10-CM | POA: Diagnosis present

## 2023-11-06 LAB — URINALYSIS, ROUTINE W REFLEX MICROSCOPIC
Bilirubin Urine: NEGATIVE
Glucose, UA: NEGATIVE mg/dL
Ketones, ur: NEGATIVE mg/dL
Nitrite: NEGATIVE
Protein, ur: NEGATIVE mg/dL
Specific Gravity, Urine: 1.019 (ref 1.005–1.030)
pH: 5 (ref 5.0–8.0)

## 2023-11-06 LAB — BASIC METABOLIC PANEL WITH GFR
Anion gap: 10 (ref 5–15)
BUN: 15 mg/dL (ref 6–20)
CO2: 22 mmol/L (ref 22–32)
Calcium: 9.7 mg/dL (ref 8.9–10.3)
Chloride: 105 mmol/L (ref 98–111)
Creatinine, Ser: 1.25 mg/dL — ABNORMAL HIGH (ref 0.61–1.24)
GFR, Estimated: >60 mL/min (ref >60)
Glucose, Bld: 126 mg/dL — ABNORMAL HIGH (ref 70–99)
Potassium: 4 mmol/L (ref 3.5–5.1)
Sodium: 137 mmol/L (ref 135–145)

## 2023-11-06 LAB — CBC
HCT: 43.9 % (ref 39.0–52.0)
Hemoglobin: 14.6 g/dL (ref 13.0–17.0)
MCH: 29.6 pg (ref 26.0–34.0)
MCHC: 33.3 g/dL (ref 30.0–36.0)
MCV: 88.9 fL (ref 80.0–100.0)
Platelets: 258 K/uL (ref 150–400)
RBC: 4.94 MIL/uL (ref 4.22–5.81)
RDW: 13.2 % (ref 11.5–15.5)
WBC: 13.8 K/uL — ABNORMAL HIGH (ref 4.0–10.5)
nRBC: 0 % (ref 0.0–0.2)

## 2023-11-06 MED ORDER — MORPHINE SULFATE (PF) 4 MG/ML IV SOLN
4.0000 mg | Freq: Once | INTRAVENOUS | Status: AC
  Administered 2023-11-06: 4 mg via INTRAVENOUS
  Filled 2023-11-06: qty 1

## 2023-11-06 MED ORDER — SODIUM CHLORIDE 0.9 % IV SOLN
1.0000 g | Freq: Once | INTRAVENOUS | Status: AC
  Administered 2023-11-06: 1 g via INTRAVENOUS
  Filled 2023-11-06: qty 10

## 2023-11-06 MED ORDER — LACTATED RINGERS IV BOLUS
1000.0000 mL | Freq: Once | INTRAVENOUS | Status: AC
  Administered 2023-11-06: 1000 mL via INTRAVENOUS

## 2023-11-06 MED ORDER — MORPHINE SULFATE (PF) 2 MG/ML IV SOLN
2.0000 mg | Freq: Once | INTRAVENOUS | Status: AC
  Administered 2023-11-06: 2 mg via INTRAVENOUS
  Filled 2023-11-06: qty 1

## 2023-11-06 MED ORDER — LEVOFLOXACIN IN D5W 500 MG/100ML IV SOLN
500.0000 mg | Freq: Once | INTRAVENOUS | Status: AC
  Administered 2023-11-06: 500 mg via INTRAVENOUS
  Filled 2023-11-06: qty 100

## 2023-11-06 MED ORDER — LEVOFLOXACIN 500 MG PO TABS: 500.0000 mg | ORAL_TABLET | Freq: Every day | ORAL | 0 refills | Status: AC

## 2023-11-06 MED ORDER — KETOROLAC TROMETHAMINE 60 MG/2ML IM SOLN
30.0000 mg | Freq: Once | INTRAMUSCULAR | Status: AC
  Administered 2023-11-06: 30 mg via INTRAMUSCULAR
  Filled 2023-11-06: qty 2

## 2023-11-06 MED ORDER — LEVOFLOXACIN 500 MG PO TABS: 500.0000 mg | ORAL_TABLET | Freq: Every day | ORAL | 0 refills | Status: DC

## 2023-11-06 NOTE — ED Notes (Signed)
 Assumed care of patient.

## 2023-11-06 NOTE — ED Triage Notes (Signed)
 Pt reports that 2 days, he thought he had UTI. Hx of cystitis. Endorses genital pain and reports blood in urine this am. Pt urinated in triage and urine yellow at this time. Having let sided flank pain.

## 2023-11-06 NOTE — Discharge Instructions (Addendum)
 Please follow-up with urology and take the antibiotics as prescribed.

## 2023-11-06 NOTE — ED Provider Triage Note (Cosign Needed Addendum)
 Emergency Medicine Provider Triage Evaluation Note  Andrew Whitaker , a 48 y.o. male  was evaluated in triage.  Pt complains of flank and groin pain.  Symptoms began yesterday while patient was at work, reports feels like someone is kicking me in my balls as well as left-sided flank pain.  He endorsed an episode of hematuria at home.  No history of kidney stones, no fever, no nausea/vomiting.  Review of Systems  Positive: As above Negative: As above  Physical Exam  BP 105/68   Pulse 87   Temp 98.3 F (36.8 C)   Resp 19   SpO2 100%  Gen:   Awake Resp:  Normal effort  MSK:   Moves extremities without difficulty  Other:  In acute distress, laying on the floor in pain, left CVA tenderness  Medical Decision Making  Medically screening exam initiated at 12:09 PM.  Appropriate orders placed.  Decoda Van was informed that the remainder of the evaluation will be completed by another provider, this initial triage assessment does not replace that evaluation, and the importance of remaining in the ED until their evaluation is complete.         Glendia Rocky SAILOR, [place] 11/06/23 1213

## 2023-11-06 NOTE — ED Provider Notes (Signed)
 [place] EMERGENCY DEPARTMENT AT [facility] Provider Note   CSN: [id number] Arrival date & time: 11/06/23  1105     Patient presents with: Flank Pain and Groin Pain   Andrew Whitaker is a 48 y.o. male.  {Add pertinent medical, surgical, social history, OB history to HPI:32947}  Flank Pain  Groin Pain       Prior to Admission medications   Medication Sig Start Date End Date Taking? Authorizing Provider  meclizine  (ANTIVERT ) 12.5 MG tablet Take 1 tablet (12.5 mg total) by mouth 3 (three) times daily as needed for dizziness. 09/10/18   Wieters, Hallie C, PA-C    Allergies: Patient has no known allergies.    Review of Systems  Genitourinary:  Positive for flank pain.    Updated Vital Signs BP 121/84 (BP Location: Right Arm)   Pulse 78   Temp 99.2 F (37.3 C) (Oral)   Resp 18   Ht 6' 1 (1.854 m)   Wt 104 kg   SpO2 99%   BMI 30.25 kg/m   Physical Exam  (all labs ordered are listed, but only abnormal results are displayed) Labs Reviewed  URINALYSIS, ROUTINE W REFLEX MICROSCOPIC - Abnormal; Notable for the following components:      Result Value   APPearance HAZY (*)    Hgb urine dipstick SMALL (*)    Leukocytes,Ua MODERATE (*)    Bacteria, UA RARE (*)    All other components within normal limits  CBC - Abnormal; Notable for the following components:   WBC 13.8 (*)    All other components within normal limits  BASIC METABOLIC PANEL WITH GFR - Abnormal; Notable for the following components:   Glucose, Bld 126 (*)    Creatinine, Ser 1.25 (*)    All other components within normal limits    EKG: None  Radiology: US  SCROTUM W/DOPPLER Result Date: 11/06/2023 CLINICAL DATA:  groin pain, hematuria. EXAM: SCROTAL ULTRASOUND DOPPLER ULTRASOUND OF THE TESTICLES TECHNIQUE: Complete ultrasound examination of the testicles, epididymis, and other scrotal structures was performed. Color and spectral Doppler ultrasound were also utilized to evaluate blood  flow to the testicles. COMPARISON:  None Available. FINDINGS: Right testicle Measurements: 2.3 x 2.6 x 4.8 cm. Multiple microlithiasis noted. No focal mass. Left testicle Measurements: 2.1 x 3.1 x 3.7 cm. Multiple microlithiasis noted. No focal mass. Right epididymis:  Normal in size and appearance. Left epididymis: Mild asymmetric heterogeneity and bulkiness of the left epididymis which exhibit mildly increased blood flow, favoring acute epididymitis. Hydrocele:  None visualized. Varicocele:  None visualized. Pulsed Doppler interrogation of both testes demonstrates normal low resistance arterial and venous waveforms bilaterally. IMPRESSION: 1. Findings favoring acute left epididymitis. 2. No evidence of testicular torsion. 3. Bilateral testicular microlithiasis. Electronically Signed   By: Ree Molt M.D.   On: 11/06/2023 14:28   CT Renal Stone Study Result Date: 11/06/2023 CLINICAL DATA:  Flank and groin pain. EXAM: CT ABDOMEN AND PELVIS WITHOUT CONTRAST TECHNIQUE: Multidetector CT imaging of the abdomen and pelvis was performed following the standard protocol without IV contrast. RADIATION DOSE REDUCTION: This exam was performed according to the departmental dose-optimization program which includes automated exposure control, adjustment of the mA and/or kV according to patient size and/or use of iterative reconstruction technique. COMPARISON:  None Available. FINDINGS: Lower chest: 8 mm pulmonary nodule in the right middle lobe (series 4, image 16). Hepatobiliary: Subcentimeter focal hypodensity in the right hepatic lobe is too small to definitively characterize, although statistically favored to represent  a small cyst or hemangioma. No gallstones, gallbladder wall thickening, or biliary dilatation. Pancreas: Unremarkable. No pancreatic ductal dilatation or surrounding inflammatory changes. Spleen: Normal in size without focal abnormality. Adrenals/Urinary Tract: Adrenal glands are unremarkable. No renal  or ureteral calculi. No hydronephrosis. Bladder is minimally distended with diffuse circumferential bladder wall thickening and surrounding haziness. Stomach/Bowel: Stomach is within normal limits. Appendix appears normal. No evidence of bowel wall thickening, distention, or inflammatory changes. Vascular/Lymphatic: Abdominal aorta is normal in caliber. No enlarged abdominal or pelvic lymph nodes. Reproductive: Prostate is unremarkable. Other: No abdominal wall hernia or abnormality. No abdominopelvic ascites. No intraperitoneal free air. Musculoskeletal: No acute osseous abnormality. No suspicious osseous lesion. IMPRESSION: 1. Bladder is minimally distended with diffuse circumferential bladder wall thickening and surrounding haziness, suspicious for cystitis. Recommend correlation with urinalysis. 2. 8 mm pulmonary nodule in the right middle lobe. Non-contrast chest CT at 6-12 months is recommended. If the nodule is stable at time of repeat CT, then future CT at 18-24 months (from today's scan) is considered optional for low-risk patients, but is recommended for high-risk patients. This recommendation follows the consensus statement: Guidelines for Management of Incidental Pulmonary Nodules Detected on CT Images: From the Fleischner Society 2017; Radiology 2017; 284:228-243. Electronically Signed   By: Harrietta Sherry M.D.   On: 11/06/2023 13:19    {Document cardiac monitor, telemetry assessment procedure when appropriate:32947} Procedures   Medications Ordered in the ED  ketorolac  (TORADOL ) injection 30 mg (30 mg Intramuscular Given 11/06/23 1212)    Clinical Course as of 11/06/23 1641  Wed Nov 06, 2023  1634 Pt not in room [AL]    Clinical Course User Index [AL] Matti Minney, DO   {Click here for ABCD2, HEART and other calculators REFRESH Note before signing:1}                              Medical Decision Making Amount and/or Complexity of Data Reviewed Labs: ordered.   ***  {Document  critical care time when appropriate  Document review of labs and clinical decision tools ie CHADS2VASC2, etc  Document your independent review of radiology images and any outside records  Document your discussion with family members, caretakers and with consultants  Document social determinants of health affecting pt's care  Document your decision making why or why not admission, treatments were needed:32947:::1}   Final diagnoses:  None    ED Discharge Orders     None
